# Patient Record
Sex: Male | Born: 1950 | Race: White | Hispanic: No | Marital: Married
Health system: Southern US, Community
[De-identification: ages and names within clinical notes are randomized; demographics above are authoritative.]

---

## 2000-04-06 ENCOUNTER — Encounter: Payer: Self-pay | Admitting: Family Medicine

## 2005-11-08 ENCOUNTER — Encounter: Payer: Self-pay | Admitting: Family Medicine

## 2005-11-08 LAB — CONVERTED CEMR LAB
Chloride: 103 meq/L
Glucose, Bld: 95 mg/dL
Hgb A1c MFr Bld: 5.7 %
Potassium: 4.9 meq/L
Triglycerides: 71 mg/dL

## 2006-03-21 ENCOUNTER — Encounter: Payer: Self-pay | Admitting: Family Medicine

## 2006-05-25 ENCOUNTER — Ambulatory Visit: Payer: Self-pay | Admitting: Family Medicine

## 2006-05-25 DIAGNOSIS — Z86718 Personal history of other venous thrombosis and embolism: Secondary | ICD-10-CM

## 2006-05-25 DIAGNOSIS — R7301 Impaired fasting glucose: Secondary | ICD-10-CM

## 2006-05-25 LAB — CONVERTED CEMR LAB
Creatinine, urine POC: 100 mg/dL
Microalbumin U total vol: 10 mg/L

## 2006-05-28 ENCOUNTER — Encounter: Payer: Self-pay | Admitting: Family Medicine

## 2006-06-15 ENCOUNTER — Ambulatory Visit: Payer: Self-pay | Admitting: Family Medicine

## 2006-06-15 DIAGNOSIS — K449 Diaphragmatic hernia without obstruction or gangrene: Secondary | ICD-10-CM

## 2006-09-12 ENCOUNTER — Ambulatory Visit: Payer: Self-pay | Admitting: Family Medicine

## 2006-09-12 LAB — CONVERTED CEMR LAB: Hgb A1c MFr Bld: 5.7 %

## 2006-10-16 ENCOUNTER — Encounter: Payer: Self-pay | Admitting: Family Medicine

## 2006-10-18 ENCOUNTER — Encounter: Payer: Self-pay | Admitting: Family Medicine

## 2006-10-19 ENCOUNTER — Encounter: Payer: Self-pay | Admitting: Family Medicine

## 2006-10-19 LAB — CONVERTED CEMR LAB
ALT: 11 units/L (ref 0–53)
AST: 13 units/L (ref 0–37)
Albumin: 4.2 g/dL (ref 3.5–5.2)
BUN: 14 mg/dL (ref 6–23)
CO2: 26 meq/L (ref 19–32)
Chloride: 103 meq/L (ref 96–112)
Creatinine, Ser: 0.84 mg/dL (ref 0.40–1.50)
Glucose, Bld: 86 mg/dL (ref 70–99)
HDL: 47 mg/dL (ref >39)
LDL Cholesterol: 85 mg/dL (ref 0–99)
VLDL: 12 mg/dL (ref 0–40)

## 2006-10-22 ENCOUNTER — Encounter: Payer: Self-pay | Admitting: Family Medicine

## 2006-10-25 ENCOUNTER — Encounter: Payer: Self-pay | Admitting: Family Medicine

## 2006-10-31 ENCOUNTER — Encounter: Payer: Self-pay | Admitting: Family Medicine

## 2006-11-06 ENCOUNTER — Encounter: Payer: Self-pay | Admitting: Family Medicine

## 2006-11-22 ENCOUNTER — Encounter: Payer: Self-pay | Admitting: Family Medicine

## 2006-12-12 ENCOUNTER — Ambulatory Visit: Payer: Self-pay | Admitting: Family Medicine

## 2006-12-12 LAB — CONVERTED CEMR LAB: Hgb A1c MFr Bld: 6 %

## 2006-12-26 ENCOUNTER — Encounter: Payer: Self-pay | Admitting: Family Medicine

## 2006-12-31 ENCOUNTER — Encounter: Payer: Self-pay | Admitting: Family Medicine

## 2007-01-22 ENCOUNTER — Encounter: Payer: Self-pay | Admitting: Family Medicine

## 2007-01-29 ENCOUNTER — Telehealth (INDEPENDENT_AMBULATORY_CARE_PROVIDER_SITE_OTHER): Payer: Self-pay | Admitting: *Deleted

## 2007-04-11 ENCOUNTER — Ambulatory Visit: Payer: Self-pay | Admitting: Family Medicine

## 2007-06-12 ENCOUNTER — Encounter: Admission: RE | Admit: 2007-06-12 | Discharge: 2007-06-12 | Payer: Self-pay | Admitting: Family Medicine

## 2007-06-12 ENCOUNTER — Ambulatory Visit: Payer: Self-pay | Admitting: Family Medicine

## 2007-06-12 DIAGNOSIS — R319 Hematuria, unspecified: Secondary | ICD-10-CM

## 2007-06-12 LAB — CONVERTED CEMR LAB
Bilirubin Urine: NEGATIVE
Glucose, Urine, Semiquant: NEGATIVE
Ketones, urine, test strip: NEGATIVE
Nitrite: NEGATIVE
Protein, U semiquant: NEGATIVE
WBC Urine, dipstick: NEGATIVE
pH: 7

## 2007-06-13 ENCOUNTER — Encounter: Admission: RE | Admit: 2007-06-13 | Discharge: 2007-06-13 | Payer: Self-pay | Admitting: Family Medicine

## 2007-06-26 ENCOUNTER — Ambulatory Visit: Payer: Self-pay | Admitting: Family Medicine

## 2007-06-26 DIAGNOSIS — N401 Enlarged prostate with lower urinary tract symptoms: Secondary | ICD-10-CM

## 2007-06-26 DIAGNOSIS — J984 Other disorders of lung: Secondary | ICD-10-CM | POA: Insufficient documentation

## 2007-06-26 LAB — CONVERTED CEMR LAB
Bilirubin Urine: NEGATIVE
Glucose, Urine, Semiquant: NEGATIVE
Specific Gravity, Urine: <1.005

## 2007-06-28 LAB — CONVERTED CEMR LAB
PSA, Free Pct: 26 (ref >25)
PSA, Free: 0.4 ng/mL
PSA: 1.53 ng/mL (ref 0.10–4.00)

## 2007-07-10 ENCOUNTER — Ambulatory Visit: Payer: Self-pay | Admitting: Family Medicine

## 2007-07-10 DIAGNOSIS — R3129 Other microscopic hematuria: Secondary | ICD-10-CM

## 2007-07-10 LAB — CONVERTED CEMR LAB
Glucose, Urine, Semiquant: NEGATIVE
Nitrite: NEGATIVE
Specific Gravity, Urine: 1.02
Urobilinogen, UA: 0.2
WBC Urine, dipstick: NEGATIVE
pH: 5.5

## 2007-07-11 LAB — CONVERTED CEMR LAB
ALT: 16 units/L (ref 0–53)
Albumin: 4.2 g/dL (ref 3.5–5.2)
BUN: 18 mg/dL (ref 6–23)
Basophils Absolute: 0 10*3/uL (ref 0.0–0.1)
Calcium: 9.2 mg/dL (ref 8.4–10.5)
Eosinophils Absolute: 0.1 10*3/uL (ref 0.0–0.7)
Folate: 18.6 ng/mL
Hemoglobin: 14.1 g/dL (ref 13.0–17.0)
Lymphs Abs: 1.8 10*3/uL (ref 0.7–4.0)
MCHC: 33.1 g/dL (ref 30.0–36.0)
MCV: 88.8 fL (ref 78.0–100.0)
Monocytes Absolute: 0.9 10*3/uL (ref 0.1–1.0)
Monocytes Relative: 12 % (ref 3–12)
Neutrophils Relative %: 63 % (ref 43–77)
Potassium: 4 meq/L (ref 3.5–5.3)
Sodium: 138 meq/L (ref 135–145)
Total Bilirubin: 0.9 mg/dL (ref 0.3–1.2)
Total Protein: 7.1 g/dL (ref 6.0–8.3)

## 2007-08-01 ENCOUNTER — Ambulatory Visit: Payer: Self-pay | Admitting: Family Medicine

## 2007-08-01 LAB — CONVERTED CEMR LAB: Hgb A1c MFr Bld: 6.1 %

## 2007-08-02 ENCOUNTER — Telehealth: Payer: Self-pay | Admitting: Family Medicine

## 2007-08-15 ENCOUNTER — Encounter: Payer: Self-pay | Admitting: Family Medicine

## 2007-10-22 ENCOUNTER — Encounter: Admission: RE | Admit: 2007-10-22 | Discharge: 2007-10-22 | Payer: Self-pay | Admitting: Family Medicine

## 2007-10-22 ENCOUNTER — Ambulatory Visit: Payer: Self-pay | Admitting: Family Medicine

## 2007-10-22 DIAGNOSIS — R0789 Other chest pain: Secondary | ICD-10-CM

## 2007-10-22 DIAGNOSIS — R071 Chest pain on breathing: Secondary | ICD-10-CM

## 2007-10-22 LAB — CONVERTED CEMR LAB
BUN: 11 mg/dL (ref 6–23)
Creatinine, Ser: 0.7 mg/dL (ref 0.40–1.50)

## 2007-10-23 ENCOUNTER — Ambulatory Visit (HOSPITAL_BASED_OUTPATIENT_CLINIC_OR_DEPARTMENT_OTHER): Admission: RE | Admit: 2007-10-23 | Discharge: 2007-10-23 | Payer: Self-pay | Admitting: Critical Care Medicine

## 2007-10-23 ENCOUNTER — Ambulatory Visit: Payer: Self-pay | Admitting: Critical Care Medicine

## 2007-10-25 ENCOUNTER — Encounter: Payer: Self-pay | Admitting: Critical Care Medicine

## 2007-10-25 LAB — CONVERTED CEMR LAB
Basophils Absolute: 0 10*3/uL (ref 0.0–0.1)
Basophils Relative: 0.1 % (ref 0.0–3.0)
Eosinophils Absolute: 0.1 10*3/uL (ref 0.0–0.7)
Hemoglobin: 13.5 g/dL (ref 13.0–17.0)
Lymphocytes Relative: 22.1 % (ref 12.0–46.0)
MCHC: 34.6 g/dL (ref 30.0–36.0)
MCV: 88 fL (ref 78.0–100.0)
Monocytes Absolute: 0.6 10*3/uL (ref 0.1–1.0)
Monocytes Relative: 10.4 % (ref 3.0–12.0)
Platelets: 212 10*3/uL (ref 150–400)
WBC: 5.3 10*3/uL (ref 4.5–10.5)

## 2007-11-07 ENCOUNTER — Ambulatory Visit: Payer: Self-pay | Admitting: Critical Care Medicine

## 2008-06-10 ENCOUNTER — Ambulatory Visit: Payer: Self-pay | Admitting: Family Medicine

## 2008-06-10 DIAGNOSIS — K089 Disorder of teeth and supporting structures, unspecified: Secondary | ICD-10-CM | POA: Insufficient documentation

## 2009-02-02 ENCOUNTER — Ambulatory Visit: Payer: Self-pay | Admitting: Family Medicine

## 2009-02-02 DIAGNOSIS — M1710 Unilateral primary osteoarthritis, unspecified knee: Secondary | ICD-10-CM

## 2009-03-04 ENCOUNTER — Telehealth (INDEPENDENT_AMBULATORY_CARE_PROVIDER_SITE_OTHER): Payer: Self-pay | Admitting: *Deleted

## 2009-03-05 ENCOUNTER — Encounter: Payer: Self-pay | Admitting: Family Medicine

## 2009-03-08 ENCOUNTER — Telehealth: Payer: Self-pay | Admitting: Family Medicine

## 2009-03-25 ENCOUNTER — Ambulatory Visit: Payer: Self-pay | Admitting: Family Medicine

## 2009-03-25 DIAGNOSIS — L03119 Cellulitis of unspecified part of limb: Secondary | ICD-10-CM

## 2009-03-25 DIAGNOSIS — J159 Unspecified bacterial pneumonia: Secondary | ICD-10-CM | POA: Insufficient documentation

## 2009-03-25 DIAGNOSIS — L02419 Cutaneous abscess of limb, unspecified: Secondary | ICD-10-CM | POA: Insufficient documentation

## 2009-03-26 LAB — CONVERTED CEMR LAB
Basophils Absolute: 0 10*3/uL (ref 0.0–0.1)
Eosinophils Relative: 2 % (ref 0–5)
MCV: 87.4 fL (ref 78.0–100.0)
Monocytes Relative: 8 % (ref 3–12)
Neutro Abs: 8.3 10*3/uL — ABNORMAL HIGH (ref 1.7–7.7)
Neutrophils Relative %: 77 % (ref 43–77)
Platelets: 449 10*3/uL — ABNORMAL HIGH (ref 150–400)
RBC: 4.06 M/uL — ABNORMAL LOW (ref 4.22–5.81)
RDW: 13.5 % (ref 11.5–15.5)

## 2009-08-26 ENCOUNTER — Ambulatory Visit: Payer: Self-pay | Admitting: Family Medicine

## 2009-08-26 DIAGNOSIS — J309 Allergic rhinitis, unspecified: Secondary | ICD-10-CM | POA: Insufficient documentation

## 2009-09-04 IMAGING — CR DG ABDOMEN 1V
2 series · 2 of 2 positions shown · non-contrast
Comparison: None

CLINICAL DATA: Abdominal pain evaluate for kidney stones

ABDOMEN - 1 VIEW

[view not recorded (1 of 2)]
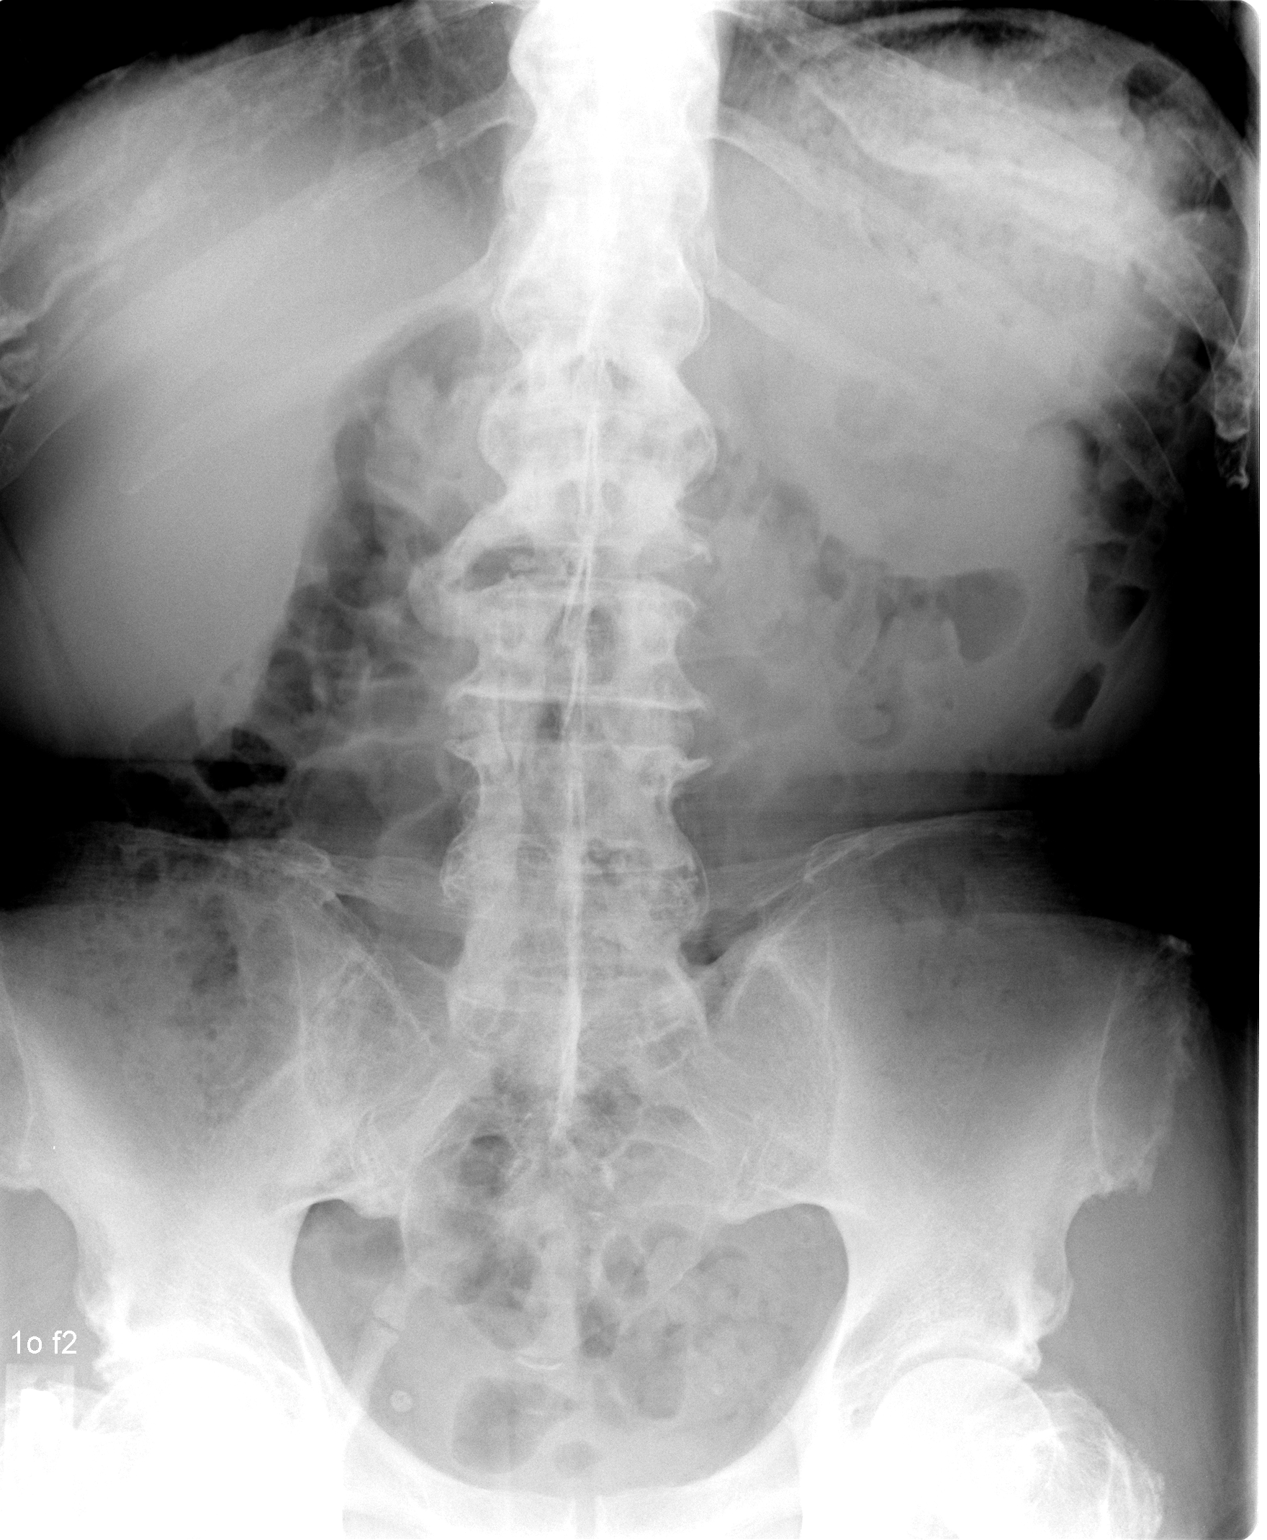

[view not recorded (2 of 2)]
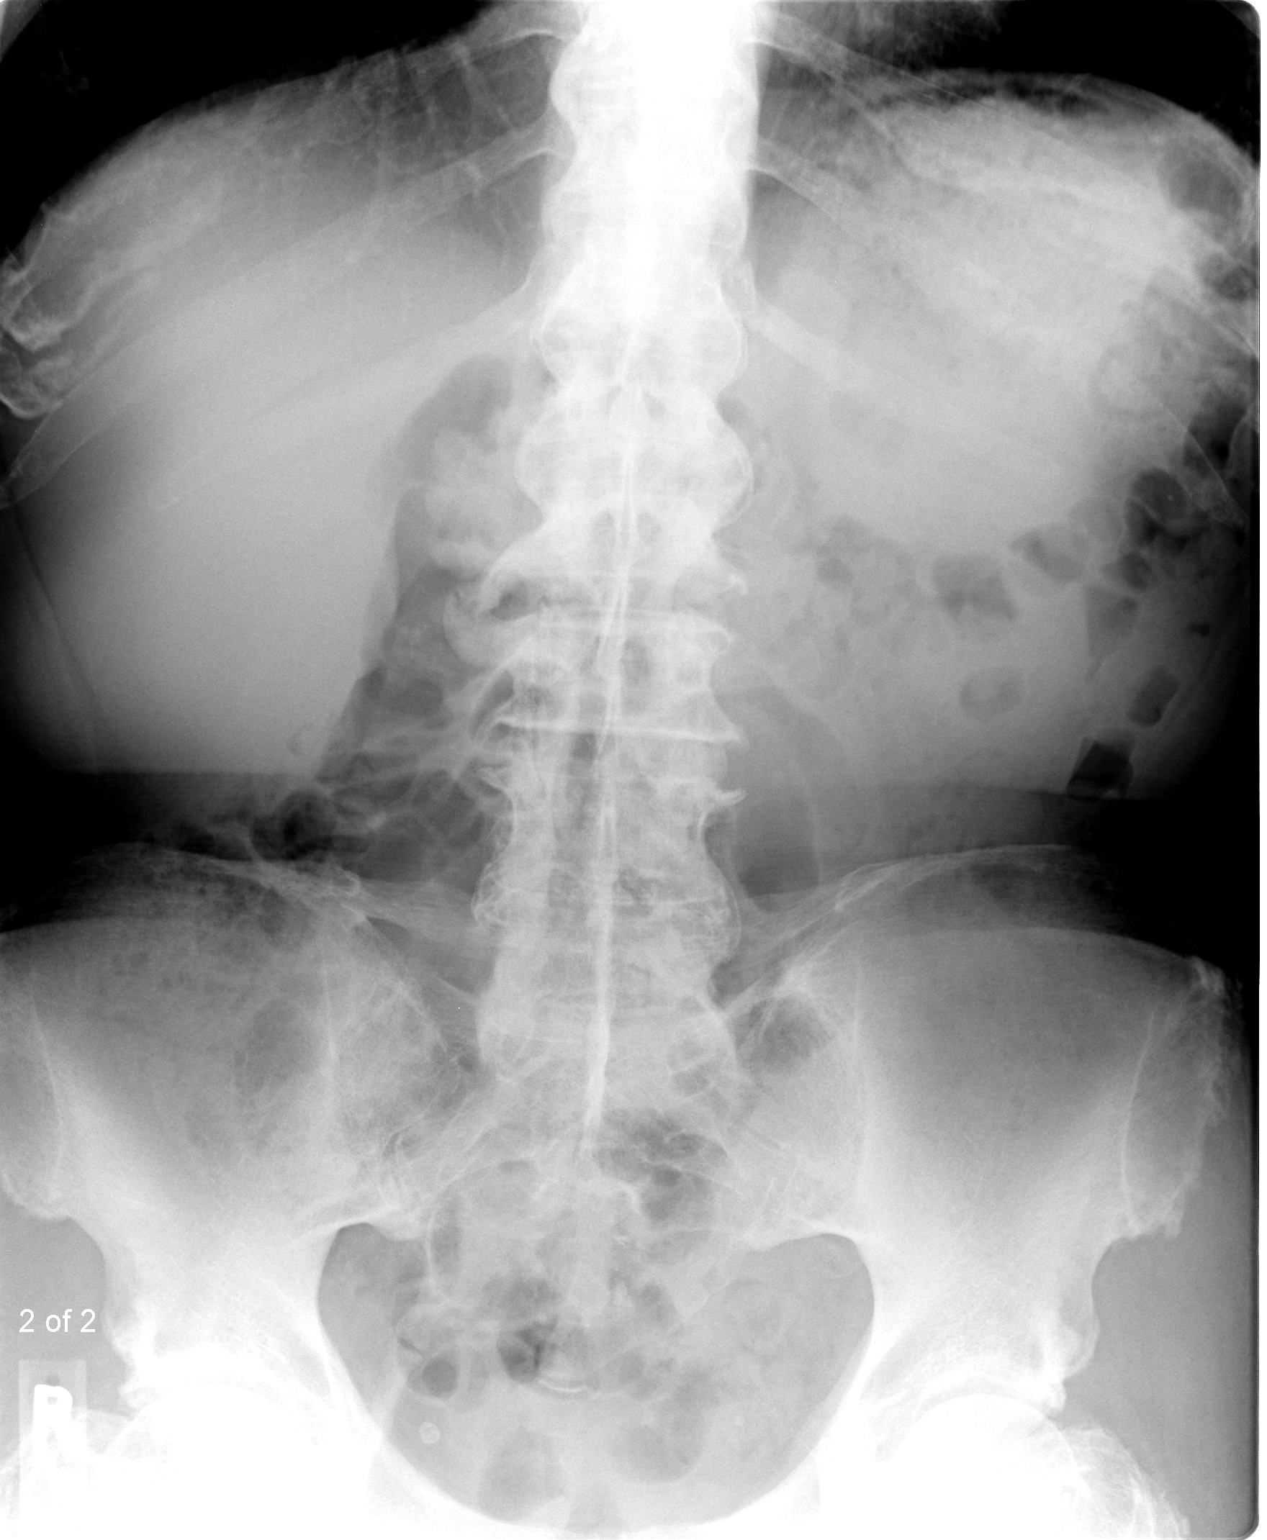

[2 of 2 positions shown; findings below may reference images not displayed]

FINDINGS: There are diffuse osteophytes noted throughout the lower
thoracic and lumbar spine.  This most likely represents diffuse
idiopathic skeletal hyperostosis.  However with calcification and
ossification noted of the ileolumbar ligament and sacrotuberous
ligaments, considerations are diffuse idiopathic skeletal
hyperostosis versus ankylosing spondylitis.  SI joints are not well
corticated No opaque renal calculi are seen.  The bowel gas pattern
is nonspecific.  .
IMPRESSION: 1.  No definite renal calculi.
2.  Diffuse osteophytes throughout the lumbar spine with
ossification of the pelvic ligaments.  These findings can be
indicative of D I [facility] or ankylosing spondylitis.

## 2009-11-15 ENCOUNTER — Ambulatory Visit: Payer: Self-pay | Admitting: Family Medicine

## 2009-11-15 DIAGNOSIS — D333 Benign neoplasm of cranial nerves: Secondary | ICD-10-CM | POA: Insufficient documentation

## 2009-11-17 ENCOUNTER — Telehealth (INDEPENDENT_AMBULATORY_CARE_PROVIDER_SITE_OTHER): Payer: Self-pay | Admitting: *Deleted

## 2009-12-07 ENCOUNTER — Telehealth: Payer: Self-pay | Admitting: Family Medicine

## 2009-12-14 DIAGNOSIS — R519 Headache, unspecified: Secondary | ICD-10-CM | POA: Insufficient documentation

## 2009-12-14 DIAGNOSIS — R51 Headache: Secondary | ICD-10-CM

## 2009-12-15 ENCOUNTER — Encounter: Payer: Self-pay | Admitting: Family Medicine

## 2009-12-19 ENCOUNTER — Telehealth: Payer: Self-pay | Admitting: Family Medicine

## 2009-12-20 ENCOUNTER — Telehealth (INDEPENDENT_AMBULATORY_CARE_PROVIDER_SITE_OTHER): Payer: Self-pay | Admitting: *Deleted

## 2010-02-03 ENCOUNTER — Ambulatory Visit
Admission: RE | Admit: 2010-02-03 | Discharge: 2010-02-03 | Payer: Self-pay | Source: Home / Self Care | Attending: Family Medicine | Admitting: Family Medicine

## 2010-02-03 DIAGNOSIS — L538 Other specified erythematous conditions: Secondary | ICD-10-CM | POA: Insufficient documentation

## 2010-02-03 LAB — CONVERTED CEMR LAB: Blood Glucose, Fingerstick: 96
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
## 2010-02-15 NOTE — Letter (Signed)
Summary: [facility]  [facility]   Imported By: Lanelle Bal 11/19/2009 10:29:48  _____________________________________________________________________  External Attachment:    Type:   Image     Comment:   External Document

## 2010-02-15 NOTE — Progress Notes (Signed)
Summary: Head pain  Phone Note Call from Patient   Summary of Call: Pt states he is still having pain on L side of head. Pt has stopped med bc it was not working. Please advise. Initial call taken by: Payton Spark CMA,  December 07, 2009 8:25 AM  Follow-up for Phone Call        I'd like him to see neurosurgery or ENT given his hx of acoustic neuroma.   Follow-up by: Seymour Bars DO,  December 07, 2009 10:27 AM  Additional Follow-up for Phone Call Additional follow up Details #1::        Pt states he prefers to see neurosurgery Additional Follow-up by: Payton Spark CMA,  December 07, 2009 11:39 AM    Additional Follow-up for Phone Call Additional follow up Details #2::    referral placed. Follow-up by: Seymour Bars DO,  December 07, 2009 12:32 PM   Appended Document: Head pain

## 2010-02-15 NOTE — Progress Notes (Signed)
Summary: Neurosurgeon referral   Phone Note From Other Clinic   Caller: Nurse Summary of Call: Pat from [facility] in [place] - Dr.Lyerly states that this patient was scheduled to see him for a referral from our office but after the neurosurgeon reviewed his MRI report he does not feel he needs to be seen since his tumor has not returned and suggest maybe he needs a pain clinic referral... Thay have called the patient and explained.Marland KitchenMarland KitchenMichaelle Copas  December 20, 2009 10:12 AM  Initial call taken by: Michaelle Copas,  December 20, 2009 10:14 AM  Follow-up for Phone Call        ok, so does he want to see Dr Jordan Likes? Follow-up by: Seymour Bars DO,  December 20, 2009 10:23 AM  Additional Follow-up for Phone Call Additional follow up Details #1::        Pt aware of the above

## 2010-02-15 NOTE — Letter (Signed)
Summary: [facility]  [facility]   Imported By: Lanelle Bal 03/12/2009 08:10:01  _____________________________________________________________________  External Attachment:    Type:   Image     Comment:   External Document

## 2010-02-15 NOTE — Progress Notes (Signed)
       New/Updated Medications: NEXIUM 40 MG CPDR (ESOMEPRAZOLE MAGNESIUM) Take 1 capsule by mouth once daily, take 20 minutes before breakfast Prescriptions: NEXIUM 40 MG CPDR (ESOMEPRAZOLE MAGNESIUM) Take 1 capsule by mouth once daily, take 20 minutes before breakfast  #30 x 1   Entered by:   Payton Spark CMA   Authorized by:   Seymour Bars DO   Signed by:   Payton Spark CMA on 11/17/2009   Method used:   Electronically to        [facility] [phone]* (retail)       [address]       [place], [place]  [address]       Ph: [phone] or [phone]       Fax: [phone]   RxID:   [id number]
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
## 2010-02-15 NOTE — Progress Notes (Signed)
Summary: Knee surgery  ---- Converted from flag ---- ---- 03/08/2009 9:17 AM, [name] wrote: Pt states doing pretty well- has home therapy coming out.  ---- 03/05/2009 5:17 PM, [name] MD wrote: Call pt: See how he is doing after his knee replacement. ------------------------------

## 2010-02-15 NOTE — Progress Notes (Signed)
Summary: post surgical Coumadin therapy  Phone Note From Other Clinic Call back at [phone]-pager   Caller: Suzanne-PA at Ortho office Call For: [place]en Summary of Call: PA called an asks if you would follow pt PT/INR because they are going to put him on Coumadin post surgical TKR. Please page PA at number above and speak with her Initial call taken by: [name],  March 04, 2009 11:02 AM  Follow-up for Phone Call        If they tell me proper length of coumadin treatment, I will be happy to monitor his coumadin post op. Follow-up by: Seymour Bars DO,  March 04, 2009 11:04 AM  Additional Follow-up for Phone Call Additional follow up Details #1::        Pt will have Genevieve Norlander coming out to his home. Genevieve Norlander will fax to you for management. Once Pt is cleared to drive he will start to come in our office. He needs atleast 6 weeks of therapy. Additional Follow-up by: Payton Spark CMA,  March 04, 2009 1:11 PM     Appended Document: post surgical Coumadin therapy In this case, we do not get paid to manage his coumadin.  Seymour Bars, D.O.  Appended Document: post surgical Coumadin therapy They will have Pt come into our office.Ortho doesn't feel comfortable managing  Appended Document: post surgical Coumadin therapy perfect.!  I'd be happy to do this.  Seymour Bars, D.O.

## 2010-02-15 NOTE — Progress Notes (Signed)
Summary: MRI brain   Phone Note Outgoing Call   Call placed by: Seymour Bars DO,  December 19, 2009 4:05 PM Summary of Call: Pls let pt know that MRI of the brain shows only postoperative changes in the L internal auditory canal.  He is welcome to consult with neurosurgeon, thought I do not see anything surgically to treat on here. Initial call taken by: Seymour Bars DO,  December 19, 2009 4:06 PM     Appended Document: MRI brain  Pt aware of the above
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
## 2011-05-15 ENCOUNTER — Encounter: Payer: Self-pay | Admitting: Physician Assistant

## 2011-05-15 ENCOUNTER — Ambulatory Visit (INDEPENDENT_AMBULATORY_CARE_PROVIDER_SITE_OTHER): Payer: BC Managed Care – PPO | Admitting: Physician Assistant

## 2011-05-15 VITALS — BP 151/77 | HR 62 | Ht 64.5 in | Wt 150.0 lb

## 2011-05-15 DIAGNOSIS — R030 Elevated blood-pressure reading, without diagnosis of hypertension: Secondary | ICD-10-CM

## 2011-05-15 MED ORDER — LISINOPRIL 10 MG PO TABS: 10.0000 mg | ORAL_TABLET | Freq: Every day | ORAL | Status: DC

## 2011-05-15 NOTE — Progress Notes (Signed)
  Subjective:    Patient ID: Omar Marsh, male    DOB: 1950/08/17, 61 y.o.   MRN: [id number]  HPI Patient presents to the clinic because he checked his blood pressure this morning and it was 167/90. He periodically checks his blood pressure on home. Normally he is in the 120s over 80 but for the last week or so he's been in the 140s or 50s over 90s. He did have knee replacement surgery 4 weeks ago. They stopped prescribing pain meds in about 10 days ago. He is only using Tylenol to control his pain. He reports that his pain on average is a 4/10 and then without Tylenol can increase to a 7/10. He does report that he is somewhat anxious but does not think he has a problem with anxiety. He denies any food changes with a lot of salt. He denies any headache, shortness of breath, chest pains, or palpitations. He has never had a blood pressure problem in the past.  Review of Systems     Objective:   Physical Exam  Constitutional: He is oriented to person, place, and time. He appears well-developed and well-nourished.  Cardiovascular: Normal rate, regular rhythm, normal heart sounds and intact distal pulses.   Pulmonary/Chest: Effort normal and breath sounds normal. He has no wheezes.  Neurological: He is alert and oriented to person, place, and time.  Skin: Skin is warm and dry.  Psychiatric: He has a normal mood and affect. His behavior is normal.          Assessment & Plan:  Elevated blood pressure-I suspect the elevated blood pressure today might be due to pain and anxiety. Patient was worried about this palpation therefore I did put him on lisinopril 10 mg once a day. I did instruct him that this could be a temporary medication. I suspect that when pain decreases his anxiety will decrease and then his blood pressure will decrease. Patient is used to exercising daily and once he gets back to walking and feeling better his blood pressure will back to normal. We will recheck in 1 month. I did  tell patient to continue monitoring his blood pressure on regular basis and it starts to notice a drop anything under 100 on the top that he should call office and we can start decreasing the lisinopril. I made patient aware of the side effect of cough if he started to have a cough he should call the office.  Patient is aware that he needs a colonoscopy and if he. He reports that he wants to wait until his: Better with his knee him to pursue does health maintenance items.

## 2011-05-15 NOTE — Patient Instructions (Addendum)
Make sure diet does not have a lot of salt. Continue to check blood pressure daily for next couple of weeks. Watch out as pain decreases for your blood pressure to decrease. Will start Lisinopril 10mg  once a day. Recheck 1 month.   Think about shingles vaccine.

## 2011-05-17 ENCOUNTER — Telehealth: Payer: Self-pay | Admitting: *Deleted

## 2011-05-17 NOTE — Telephone Encounter (Signed)
Called patient and let him know to increase his blood pressure medication to 2 tabs to equal 20mg  of lisinopril daily and to continue check bp regularly. He was reminded not to take bp after PT session or when in a lot of pain with knee replacement. He will call back in with blood pressure readings.

## 2011-05-17 NOTE — Telephone Encounter (Signed)
Pt states he has taken the Lisinopril for 2 days and states his bp is 187/90. States it is higher now than before. Please advise.

## 2011-05-18 ENCOUNTER — Telehealth: Payer: Self-pay | Admitting: *Deleted

## 2011-05-18 DIAGNOSIS — I10 Essential (primary) hypertension: Secondary | ICD-10-CM

## 2011-05-18 MED ORDER — LISINOPRIL-HYDROCHLOROTHIAZIDE 20-25 MG PO TABS: 1.0000 | ORAL_TABLET | Freq: Every day | ORAL | Status: DC

## 2011-05-18 NOTE — Telephone Encounter (Signed)
Pt notified of MD instructions. KJ LPN 

## 2011-05-18 NOTE — Telephone Encounter (Signed)
Will send over new BP med in its place.  Also needs labwork CMP, lipids, TSH. Will fax labslip.   If hasn't gone already.  THen keep f/u apptn.

## 2011-05-18 NOTE — Telephone Encounter (Signed)
Pt called yesterday and Lesly Rubenstein gave him instructions to increase his BP to 2 tabs. Pt has called stating that his BP now is 175/90. Please advise.

## 2011-05-19 LAB — COMPLETE METABOLIC PANEL WITH GFR
ALT: 10 U/L (ref 0–53)
Alkaline Phosphatase: 47 U/L (ref 39–117)
BUN: 13 mg/dL (ref 6–23)
CO2: 32 mEq/L (ref 19–32)
Calcium: 9.1 mg/dL (ref 8.4–10.5)
Creat: 0.71 mg/dL (ref 0.50–1.35)
GFR, Est African American: >89 mL/min
GFR, Est Non African American: >89 mL/min
Total Protein: 7.3 g/dL (ref 6.0–8.3)

## 2011-05-19 LAB — LIPID PANEL: Cholesterol: 176 mg/dL (ref 0–200)

## 2011-05-22 ENCOUNTER — Telehealth: Payer: Self-pay | Admitting: *Deleted

## 2011-05-26 ENCOUNTER — Encounter: Payer: Self-pay | Admitting: Physician Assistant

## 2011-05-26 ENCOUNTER — Ambulatory Visit (INDEPENDENT_AMBULATORY_CARE_PROVIDER_SITE_OTHER): Payer: BC Managed Care – PPO | Admitting: Physician Assistant

## 2011-05-26 VITALS — BP 116/74 | HR 70 | Ht 64.5 in | Wt 149.0 lb

## 2011-05-26 DIAGNOSIS — R031 Nonspecific low blood-pressure reading: Secondary | ICD-10-CM

## 2011-05-26 MED ORDER — LISINOPRIL-HYDROCHLOROTHIAZIDE 20-12.5 MG PO TABS: 1.0000 | ORAL_TABLET | Freq: Every day | ORAL | Status: DC

## 2011-05-26 NOTE — Patient Instructions (Addendum)
Recheck with nurse visit to check blood pressure in one week. Decreased Lisinopril 20/12.5mg  daily. Continue to check blood pressure. Call on Monday if still having lows.

## 2011-05-27 NOTE — Progress Notes (Signed)
  Subjective:    Patient ID: Omar Marsh, male    DOB: 1950/05/12, 61 y.o.   MRN: [id number]  HPI Patient comes in because his bp medication was changed b/c blood pressure still running high. He has been on Lisinopril/HCTZ 20/25 once a day. He is now having lows and feels like he is going to pass out every time he stands up. Checked bp and been running 90's over 60's. He is concerned b/c he lives alone.    Review of Systems     Objective:   Physical Exam  Constitutional: He is oriented to person, place, and time. He appears well-developed and well-nourished.  HENT:  Head: Normocephalic and atraumatic.  Cardiovascular: Normal rate, regular rhythm and normal heart sounds.   Pulmonary/Chest: Effort normal and breath sounds normal. He has no wheezes.  Neurological: He is alert and oriented to person, place, and time.  Skin: Skin is warm and dry.  Psychiatric: He has a normal mood and affect. His behavior is normal.          Assessment & Plan:  Low bp reading.-BP looks great today. Did discuss with patient that because bp has dropped significantly he might be feeling different because of the drop. Decrease Lisinopril/HCTZ 20/12.5 mg. I choose this because it did not get bp lowering benefit at lisinopril 20mg  therefore decrease to half what he was on. Will recheck with nurse visit in 1 week. If bp good will recheck in office in 1 month.

## 2011-06-01 ENCOUNTER — Telehealth: Payer: Self-pay | Admitting: *Deleted

## 2011-06-01 ENCOUNTER — Ambulatory Visit (INDEPENDENT_AMBULATORY_CARE_PROVIDER_SITE_OTHER): Payer: BC Managed Care – PPO | Admitting: Family Medicine

## 2011-06-01 VITALS — BP 117/57 | HR 67

## 2011-06-01 DIAGNOSIS — I10 Essential (primary) hypertension: Secondary | ICD-10-CM

## 2011-06-01 NOTE — Telephone Encounter (Signed)
Pt notfied of Dr. Instructions in re BP check and voiced understanding

## 2011-06-01 NOTE — Progress Notes (Addendum)
  Subjective:    Patient ID: Omar Marsh, male    DOB: 18-Apr-1950, 61 y.o.   MRN: [id number] BP check. Pt states did not take BP med this morning because his BP was 110/68. HPI    Review of Systems     Objective:   Physical Exam        Assessment & Plan:  HTN - BP is at goal. Hold BP med for one month and recheck in 1 months.  Cipriano Bunker, MD

## 2011-06-13 ENCOUNTER — Encounter: Payer: Self-pay | Admitting: Family Medicine

## 2011-06-13 ENCOUNTER — Ambulatory Visit (INDEPENDENT_AMBULATORY_CARE_PROVIDER_SITE_OTHER): Payer: BC Managed Care – PPO | Admitting: Family Medicine

## 2011-06-13 VITALS — BP 127/73 | HR 71 | Ht 64.5 in | Wt 152.0 lb

## 2011-06-13 DIAGNOSIS — R21 Rash and other nonspecific skin eruption: Secondary | ICD-10-CM

## 2011-06-13 DIAGNOSIS — B86 Scabies: Secondary | ICD-10-CM

## 2011-06-13 DIAGNOSIS — T887XXA Unspecified adverse effect of drug or medicament, initial encounter: Secondary | ICD-10-CM

## 2011-06-13 DIAGNOSIS — I959 Hypotension, unspecified: Secondary | ICD-10-CM

## 2011-06-13 DIAGNOSIS — I10 Essential (primary) hypertension: Secondary | ICD-10-CM

## 2011-06-13 DIAGNOSIS — L259 Unspecified contact dermatitis, unspecified cause: Secondary | ICD-10-CM

## 2011-06-13 MED ORDER — PERMETHRIN 5 % EX CREA: TOPICAL_CREAM | Freq: Once | CUTANEOUS | Status: AC

## 2011-06-13 MED ORDER — PREDNISONE (PAK) 10 MG PO TABS: ORAL_TABLET | ORAL | Status: DC

## 2011-06-13 NOTE — Patient Instructions (Signed)
Contact Dermatitis Contact dermatitis is a reaction to certain substances that touch the skin. Contact dermatitis can be either irritant contact dermatitis or allergic contact dermatitis. Irritant contact dermatitis does not require previous exposure to the substance for a reaction to occur.Allergic contact dermatitis only occurs if you have been exposed to the substance before. Upon a repeat exposure, your body reacts to the substance.  CAUSES  Many substances can cause contact dermatitis. Irritant dermatitis is most commonly caused by repeated exposure to mildly irritating substances, such as:  Makeup.   Soaps.   Detergents.   Bleaches.   Acids.   Metal salts, such as nickel.  Allergic contact dermatitis is most commonly caused by exposure to:  Poisonous plants.   Chemicals (deodorants, shampoos).   Jewelry.   Latex.   Neomycin in triple antibiotic cream.   Preservatives in products, including clothing.  SYMPTOMS  The area of skin that is exposed may develop:  Dryness or flaking.   Redness.   Cracks.   Itching.   Pain or a burning sensation.   Blisters.  With allergic contact dermatitis, there may also be swelling in areas such as the eyelids, mouth, or genitals.  DIAGNOSIS  Your caregiver can usually tell what the problem is by doing a physical exam. In cases where the cause is uncertain and an allergic contact dermatitis is suspected, a patch skin test may be performed to help determine the cause of your dermatitis. TREATMENT Treatment includes protecting the skin from further contact with the irritating substance by avoiding that substance if possible. Barrier creams, powders, and gloves may be helpful. Your caregiver may also recommend:  Steroid creams or ointments applied 2 times daily. For best results, soak the rash area in cool water for 20 minutes. Then apply the medicine. Cover the area with a plastic wrap. You can store the steroid cream in the  refrigerator for a "chilly" effect on your rash. That may decrease itching. Oral steroid medicines may be needed in more severe cases.   Antibiotics or antibacterial ointments if a skin infection is present.   Antihistamine lotion or an antihistamine taken by mouth to ease itching.   Lubricants to keep moisture in your skin.   Burow's solution to reduce redness and soreness or to dry a weeping rash. Mix one packet or tablet of solution in 2 cups cool water. Dip a clean washcloth in the mixture, wring it out a bit, and put it on the affected area. Leave the cloth in place for 30 minutes. Do this as often as possible throughout the day.   Taking several cornstarch or baking soda baths daily if the area is too large to cover with a washcloth.  Harsh chemicals, such as alkalis or acids, can cause skin damage that is like a burn. You should flush your skin for 15 to 20 minutes with cold water after such an exposure. You should also seek immediate medical care after exposure. Bandages (dressings), antibiotics, and pain medicine may be needed for severely irritated skin.  HOME CARE INSTRUCTIONS  Avoid the substance that caused your reaction.   Keep the area of skin that is affected away from hot water, soap, sunlight, chemicals, acidic substances, or anything else that would irritate your skin.   Do not scratch the rash. Scratching may cause the rash to become infected.   You may take cool baths to help stop the itching.   Only take over-the-counter or prescription medicines as directed by your caregiver.     See your caregiver for follow-up care as directed to make sure your skin is healing properly.  SEEK MEDICAL CARE IF:   Your condition is not better after 3 days of treatment.   You seem to be getting worse.   You see signs of infection such as swelling, tenderness, redness, soreness, or warmth in the affected area.   You have any problems related to your medicines.  Document Released:  12/31/1999 Document Revised: 12/22/2010 Document Reviewed: 06/07/2010 [facility] Patient Information 2012 [place], [place].Body Lice, Frequently Asked Questions Body lice are insects that live on the body and in the clothing or bedding of infested humans. Infestation is common, found worldwide, and affects people of all races. Body lice infestations spread rapidly under crowded conditions where hygiene is poor and where there is frequent contact among people.  WHERE ARE BODY LICE FOUND? Body lice are found on the body and on clothing or bedding used by infested people. Lice eggs are laid in the seams of clothing or on bedding. Occasionally eggs are attached to body hair. Lice found on the hair and head are not body lice; they are head lice. Lice are usually associated with poor personal hygiene, which may occur during war or natural disaster. Infestation is unlikely in anyone who bathes regularly. CAN BODY LICE TRANSMIT DISEASE? Yes. Epidemics of typhus and louse-borne relapsing fever have been caused by body lice. (Louse is the singular form of lice.) Though typhus is no longer widespread, epidemics still occur during times of war, civil unrest, natural disasters, in refugee camps, and in prisons where people live crowded together in unsanitary conditions. Typhus still exists in places where climate, chronic poverty, and social customs prevent regular changes and laundering of clothing. WHAT ARE THE SIGNS AND SYMPTOMS OF BODY LICE? Itching and rash are common. Both of these symptoms are your body's allergic reaction to the lice bite. Severe infestation can cause fever, body and headaches. Long-term body lice infestations may lead to thickening and discoloration of the skin, particularly around the waist, groin, and upper thighs. Sores on the body may be caused by scratching. These sores can sometimes become infected with bacteria or fungi. HOW ARE BODY LICE SPREAD? Body lice are spread directly through  contact with a person who has body lice, or indirectly through shared clothing, beds, bed linens, or towels. WHAT DO BODY LICE LOOK LIKE? There are three forms of body lice:   The egg (sometimes called a nit).   Nits are body lice eggs. They are generally easy to see in the seams of clothing, particularly around the waistline and under armpits. They are a bit smaller than the size of a pinhead. Nits may also be attached to body hair. They are oval and usually yellow to white. Nits may take 30 days to hatch.   The nymph.   The egg hatches into a baby louse called a nymph. It looks like an adult body louse but is smaller. Nymphs mature into adults about 7 days after hatching. To live, the nymph must feed on blood.   The adult.   The adult body louse is about the size of a sesame seed, has 6 legs and is tan to grayish-white. Females lay eggs. To live, adult lice need to feed on blood. If the louse falls off of a person, it dies within 10 days.  HOW IS A BODY LICE INFESTATION DIAGNOSED? Diagnosis is made by looking closely in the seams of clothing and on the body for eggs  and for crawling lice. Diagnosis should be made by a health care provider if you are unsure about infestation.  HOW ARE BODY LICE TREATED? Lice infestations are generally treated by giving the infested person a clean change of clothes, a shower, and by laundering all worn clothing, bed linens, and towels. When laundering items, use the hot cycle (130F / 55C) of the washing machine. Set the dryer to the hot cycle to dry items. Items that cannot be laundered may be stored in a sealed plastic bag for 2 weeks or thrown away. Additionally, a 1% permethrin or pyrethrin lice shampoo, (also called pediculicide), may be applied to the body. Medication should be applied exactly as directed on the bottle or by your caregiver. Medicine is generally not needed if good hygiene is maintained and if laundering can be done at least once a  week. Document Released: 09/14/2003 Document Revised: 12/22/2010 Document Reviewed: 07/22/2007 [facility] Patient Information 2012 [place], [place].

## 2011-06-13 NOTE — Progress Notes (Signed)
  Subjective:    Patient ID: Omar Marsh, male    DOB: May 20, 1950, 61 y.o.   MRN: [id number]  Rash This is a new problem. The current episode started 1 to 4 weeks ago. The problem has been gradually worsening since onset. The affected locations include the left elbow, right elbow, right shoulder, right arm, right lower leg and right upper leg. The rash is characterized by burning. He was exposed to nothing. Associated symptoms include fatigue and joint pain. Pertinent negatives include no congestion, cough, facial edema, fever, nail changes, rhinorrhea, shortness of breath, sore throat or vomiting. Past treatments include topical steroids. The treatment provided no relief. His past medical history is significant for eczema. There is no history of allergies, asthma or varicella.    Also with some dizzyness. Thinks his BP medication is too strong.  Review of Systems  Constitutional: Positive for fatigue. Negative for fever.  HENT: Negative for congestion, sore throat and rhinorrhea.   Respiratory: Negative for cough and shortness of breath.   Gastrointestinal: Negative for vomiting.  Musculoskeletal: Positive for joint pain.  Skin: Positive for rash. Negative for nail changes.      BP 127/73  Pulse 71  Ht 5' 4.5" (1.638 m)  Wt 152 lb (68.947 kg)  BMI 25.69 kg/m2  SpO2 98% Objective:   Physical Exam  Constitutional: He is oriented to person, place, and time. He appears well-developed and well-nourished.  Musculoskeletal:       Surgical scar present over both knees with the right knee swollen  and scar more prominent over the right knee.  Neurological: He is alert and oriented to person, place, and time.  Skin:       Rash is present over both sides of lower back mid back both arms and forearms as well. The rash is scattered as well and there is some small lesions consistent with burrowing that scabies would do There is also because of the marked excoriation present may be an element  of contact dermatitis as well.  Psychiatric: He has a normal mood and affect. His behavior is normal.      Assessment & Plan:  #1 rash. Possible pediculosis infection/contact dermatitis/history of eczema. We'll place patient on Elimite lotion with hygiene instructions on not to wear any clothes that he may have warn in the last 48 hours for at least another 48-72 hours. Changing his linen before he goes to bed tonight and again in the morning and leaving the lotion on for at least 8-12 hours and suggestion of treating his spouse as well.   For the contact dermatitis/history of eczema will place him on a tapered prednisone dosage over the next 12 days. He was warned of the possible rebound that can occur by not fishing his treatment. #2 hypertension /hypotension. He is concerned about his blood pressure medicine. He states that the current dosage of lisinopril 20/12.5 makes his blood pressure too low at times and makes him urinate and excessive amount that he is worried at times that he has prostate trouble. In reviewing the medication dosing that he has undergone I'm going to suggest to him that he takes a half a tablet making his dose of lisinopril HCTZ essentially 10/6.25. Return followup at his next regular scheduled appointment.

## 2011-07-06 ENCOUNTER — Ambulatory Visit (INDEPENDENT_AMBULATORY_CARE_PROVIDER_SITE_OTHER): Payer: BC Managed Care – PPO | Admitting: Family Medicine

## 2011-07-06 ENCOUNTER — Encounter: Payer: Self-pay | Admitting: Family Medicine

## 2011-07-06 VITALS — BP 105/50 | HR 66 | Wt 149.0 lb

## 2011-07-06 DIAGNOSIS — L259 Unspecified contact dermatitis, unspecified cause: Secondary | ICD-10-CM

## 2011-07-06 DIAGNOSIS — I10 Essential (primary) hypertension: Secondary | ICD-10-CM

## 2011-07-06 DIAGNOSIS — R21 Rash and other nonspecific skin eruption: Secondary | ICD-10-CM

## 2011-07-06 DIAGNOSIS — B86 Scabies: Secondary | ICD-10-CM

## 2011-07-06 MED ORDER — RANITIDINE HCL 150 MG PO TABS: 150.0000 mg | ORAL_TABLET | Freq: Two times a day (BID) | ORAL | Status: DC

## 2011-07-06 MED ORDER — PREDNISONE (PAK) 10 MG PO TABS: ORAL_TABLET | ORAL | Status: DC

## 2011-07-06 MED ORDER — FEXOFENADINE HCL 180 MG PO TABS: 180.0000 mg | ORAL_TABLET | Freq: Every day | ORAL | Status: DC

## 2011-07-06 MED ORDER — LISINOPRIL-HYDROCHLOROTHIAZIDE 20-12.5 MG PO TABS: 0.5000 | ORAL_TABLET | Freq: Every day | ORAL | Status: DC

## 2011-07-06 MED ORDER — DOXEPIN HCL 150 MG PO CAPS: 150.0000 mg | ORAL_CAPSULE | Freq: Every day | ORAL | Status: DC

## 2011-07-06 NOTE — Patient Instructions (Signed)
Hives Hives (urticaria) are itchy, red, swollen patches on the skin. They may change size, shape, and location quickly and repeatedly. Hives that occur deeper in the skin can cause swelling of the hands, feet, and face. Hives may be an allergic reaction to something you or your child ate, touched, or put on the skin. Hives can also be a reaction to cold, heat, viral infections, medication, insect bites, or emotional stress. Often the cause is hard to find. Hives can come and go for several days to several weeks. Hives are not contagious. HOME CARE INSTRUCTIONS   If the cause of the hives is known, avoid exposure to that source.   To relieve itching and rash:   Apply cold compresses to the skin or take cool water baths. Do not take or give your child hot baths or showers because the warmth will make the itching worse.   The best medicine for hives is an antihistamine. An antihistamine will not cure hives, but it will reduce their severity. You can use an antihistamine available over the counter. This medicine may make your child sleepy. Teenagers should not drive while using this medicine.   Take or give an antihistamine every 6 hours until the hives are completely gone for 24 hours or as directed.   Your child may have other medications prescribed for itching. Give these as directed by your child's caregiver.   You or your child should wear loose fitting clothing, including undergarments. Skin irritations may make hives worse.   Follow-up as directed by your caregiver.  SEEK MEDICAL CARE IF:   You or your child still have considerable itching after taking the medication (prescribed or purchased over the counter).   Joint swelling or pain occurs.  SEEK IMMEDIATE MEDICAL CARE IF:   You have a fever.   Swollen lips or tongue are noticed.   There is difficulty with breathing, swallowing, or tightness in the throat or chest.   Abdominal pain develops.   Your child starts acting very  sick.  These may be the first signs of a life-threatening allergic reaction. THIS IS AN EMERGENCY. Call 911 for medical help. MAKE SURE YOU:   Understand these instructions.   Will watch your condition.   Will get help right away if you are not doing well or get worse.  Document Released: 01/02/2005 Document Revised: 12/22/2010 Document Reviewed: 08/23/2007 [employer] Patient Information 2012 [employer]. 

## 2011-07-06 NOTE — Progress Notes (Signed)
  Subjective:    Patient ID: Omar Marsh, male    DOB: [birth year], 61 y.o.   MRN: [id number]  Urticaria This is a recurrent problem. The current episode started more than 1 month ago. The problem has been gradually worsening since onset. The affected locations include the torso, chest, left arm and right arm. The rash is characterized by itchiness. He was exposed to nothing. Pertinent negatives include no anorexia, congestion, cough, diarrhea, facial edema, fatigue, fever, joint pain, nail changes, rhinorrhea, shortness of breath, sore throat or vomiting. Past treatments include anti-itch cream (elmite  cream, prednisone). The treatment provided moderate relief. There is no history of allergies, asthma, eczema or varicella.   Rash his wife thinks he has a nervous condition. Should be noted that before I was concerned about pediculosis infection and he did follow instructions on the use of Elimite beyond my recommendation. He actually threw out pillowcases and new pillows as well. He did state that when he took the prednisone things did get a lot better quicker but once he finished the prednisone the rash came back.  #2 states that his blood pressure is doing much better with half a tablet of combination and in fact wants to know when he might be able to wean himself off of the blood pressure medicine. Review of Systems  Constitutional: Negative for fever and fatigue.  HENT: Negative for congestion, sore throat and rhinorrhea.   Respiratory: Negative for cough and shortness of breath.   Gastrointestinal: Negative for vomiting, diarrhea and anorexia.  Musculoskeletal: Negative for joint pain.  Skin: Negative for nail changes.  Psychiatric/Behavioral: The patient is nervous/anxious.         BP 105/50  Pulse 66  Wt 149 lb (67.586 kg) Objective:   Physical Exam  Constitutional: He is oriented to person, place, and time. He appears well-developed and well-nourished.  HENT:  Head:  Normocephalic.  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  Neurological: He is alert and oriented to person, place, and time.  Skin: Skin is warm and dry. Rash noted. There is erythema.       The rash does have some excoriation but it also blanches and excoriation is probably secondary to scratching but he has been doing.  Psychiatric: He has a normal mood and affect.      Assessment & Plan:  #I1rash/urticaria hives. I think instead of scabies is maybe some type of atypical urticaria. We'll place on a shorter course of prednisone only 6 days. Also place on H1 blocker Allegra 180, H2 blocker Zantac twice a day, and doxepin for the itching and his nerves. Return in 3 weeks followup. #2 hypertension under great control continue with half a tablet of lisinopril HCTZ.

## 2011-07-27 ENCOUNTER — Ambulatory Visit (INDEPENDENT_AMBULATORY_CARE_PROVIDER_SITE_OTHER): Payer: BC Managed Care – PPO | Admitting: Family Medicine

## 2011-07-27 ENCOUNTER — Encounter: Payer: Self-pay | Admitting: Family Medicine

## 2011-07-27 VITALS — BP 95/59 | HR 66 | Ht 64.5 in | Wt 151.0 lb

## 2011-07-27 DIAGNOSIS — B86 Scabies: Secondary | ICD-10-CM

## 2011-07-27 DIAGNOSIS — I10 Essential (primary) hypertension: Secondary | ICD-10-CM

## 2011-07-27 DIAGNOSIS — L259 Unspecified contact dermatitis, unspecified cause: Secondary | ICD-10-CM

## 2011-07-27 DIAGNOSIS — L509 Urticaria, unspecified: Secondary | ICD-10-CM

## 2011-07-27 DIAGNOSIS — R21 Rash and other nonspecific skin eruption: Secondary | ICD-10-CM

## 2011-07-27 MED ORDER — PREDNISONE (PAK) 10 MG PO TABS: ORAL_TABLET | ORAL | Status: DC

## 2011-07-27 MED ORDER — MONTELUKAST SODIUM 10 MG PO TABS: 10.0000 mg | ORAL_TABLET | Freq: Every day | ORAL | Status: DC

## 2011-07-27 NOTE — Progress Notes (Signed)
  Subjective:    Patient ID: Omar Marsh, male    DOB: January 23, 1950, 61 y.o.   MRN: [id number]  Rash   The patient and his wife reports that 3 or 4 days after using the prednisone the rash recur. He states that as long as he is on the prednisone everything is fine but several days later the rash will recur. He also reports that the doxepin caused CNS side effects the following day so he stopped that. He is still taking the Zantac and the antihistamine. He reports significant frustration this condition and that of asked if there is anything else they can use other than the prednisone. He has seen a local dermatologist before but his wife wasn't too thrilled about returning to see him.   Review of Systems  Skin: Positive for rash.      BP 95/59  Pulse 66  Ht 5' 4.5" (1.638 m)  Wt 151 lb (68.493 kg)  BMI 25.52 kg/m2  SpO2 100% Objective:   Physical Exam  Constitutional: He is oriented to person, place, and time. He appears well-nourished.  Neurological: He is alert and oriented to person, place, and time.  Skin:       Fine raised rash present on knees and elbows consistent with previous rash.      Assessment & Plan:  Rash. Will get a dermatology referral to obtain a second opinion at the [facility] per their request. Will try him on Singulair to see if that will help if this is allergic based. Because there may be a delay of 3-6 weeks before he can see a dermatologist we'll put him on another round of prednisone if needed. Did explain to them that I want him off the prednisone for least 2 weeks before they see a dermatologist.

## 2011-07-27 NOTE — Patient Instructions (Signed)
Urticaria  Hives Hives (urticaria) are itchy, red, swollen patches on the skin. They may change size, shape, and location quickly and repeatedly. Hives that occur deeper in the skin can cause swelling of the hands, feet, and face. Hives may be an allergic reaction to something you or your child ate, touched, or put on the skin. Hives can also be a reaction to cold, heat, viral infections, medication, insect bites, or emotional stress. Often the cause is hard to find. Hives can come and go for several days to several weeks. Hives are not contagious. HOME CARE INSTRUCTIONS   If the cause of the hives is known, avoid exposure to that source.   To relieve itching and rash:   Apply cold compresses to the skin or take cool water baths. Do not take or give your child hot baths or showers because the warmth will make the itching worse.   The best medicine for hives is an antihistamine. An antihistamine will not cure hives, but it will reduce their severity. You can use an antihistamine available over the counter. This medicine may make your child sleepy. Teenagers should not drive while using this medicine.   Take or give an antihistamine every 6 hours until the hives are completely gone for 24 hours or as directed.   Your child may have other medications prescribed for itching. Give these as directed by your child's caregiver.   You or your child should wear loose fitting clothing, including undergarments. Skin irritations may make hives worse.   Follow-up as directed by your caregiver.  SEEK MEDICAL CARE IF:   You or your child still have considerable itching after taking the medication (prescribed or purchased over the counter).   Joint swelling or pain occurs.  SEEK IMMEDIATE MEDICAL CARE IF:   You have a fever.   Swollen lips or tongue are noticed.   There is difficulty with breathing, swallowing, or tightness in the throat or chest.   Abdominal pain develops.   Your child starts  acting very sick.  These may be the first signs of a life-threatening allergic reaction. THIS IS AN EMERGENCY. Call 911 for medical help. MAKE SURE YOU:   Understand these instructions.   Will watch your condition.   Will get help right away if you are not doing well or get worse.  Document Released: 01/02/2005 Document Revised: 12/22/2010 Document Reviewed: 08/23/2007 [facility] Patient Information 2012 [place], [place].

## 2011-08-29 ENCOUNTER — Ambulatory Visit: Payer: BC Managed Care – PPO | Admitting: Family Medicine

## 2011-11-30 ENCOUNTER — Ambulatory Visit: Payer: BC Managed Care – PPO | Admitting: Family Medicine

## 2011-12-07 ENCOUNTER — Ambulatory Visit (INDEPENDENT_AMBULATORY_CARE_PROVIDER_SITE_OTHER): Payer: BC Managed Care – PPO | Admitting: Family Medicine

## 2011-12-07 ENCOUNTER — Encounter: Payer: Self-pay | Admitting: Family Medicine

## 2011-12-07 ENCOUNTER — Ambulatory Visit: Payer: BC Managed Care – PPO | Admitting: Family Medicine

## 2011-12-07 ENCOUNTER — Ambulatory Visit (INDEPENDENT_AMBULATORY_CARE_PROVIDER_SITE_OTHER): Payer: BC Managed Care – PPO

## 2011-12-07 VITALS — BP 122/71 | HR 62 | Ht 64.5 in | Wt 153.0 lb

## 2011-12-07 DIAGNOSIS — Z23 Encounter for immunization: Secondary | ICD-10-CM

## 2011-12-07 DIAGNOSIS — R7301 Impaired fasting glucose: Secondary | ICD-10-CM

## 2011-12-07 DIAGNOSIS — Z0000 Encounter for general adult medical examination without abnormal findings: Secondary | ICD-10-CM

## 2011-12-07 DIAGNOSIS — I10 Essential (primary) hypertension: Secondary | ICD-10-CM

## 2011-12-07 DIAGNOSIS — M542 Cervicalgia: Secondary | ICD-10-CM

## 2011-12-07 DIAGNOSIS — R21 Rash and other nonspecific skin eruption: Secondary | ICD-10-CM

## 2011-12-07 DIAGNOSIS — Z131 Encounter for screening for diabetes mellitus: Secondary | ICD-10-CM

## 2011-12-07 LAB — POCT GLYCOSYLATED HEMOGLOBIN (HGB A1C): Hemoglobin A1C: 6.1

## 2011-12-07 MED ORDER — MELOXICAM 15 MG PO TABS: 15.0000 mg | ORAL_TABLET | Freq: Every day | ORAL | Status: DC

## 2011-12-07 MED ORDER — ORPHENADRINE CITRATE ER 100 MG PO TB12: 100.0000 mg | ORAL_TABLET | Freq: Two times a day (BID) | ORAL | Status: DC

## 2011-12-07 MED ORDER — PREDNISONE (PAK) 10 MG PO TABS: 10.0000 mg | ORAL_TABLET | Freq: Every day | ORAL | Status: DC

## 2011-12-07 NOTE — Patient Instructions (Addendum)
Pneumococcal Vaccine, Polyvalent solution for injection What is this medicine? PNEUMOCOCCAL VACCINE, POLYVALENT (NEU mo KOK al vak SEEN, pol ee VEY luhnt) is a vaccine to prevent pneumococcus bacteria infection. These bacteria are a major cause of ear infections, Strep throat infections, and serious pneumonia, meningitis, or blood infections worldwide. These vaccines help the body to produce antibodies (protective substances) that help your body defend against these bacteria. This vaccine is recommended for people 2 years of age and older with health problems. It is also recommended for all adults over 50 years old. This vaccine will not treat an infection. This medicine may be used for other purposes; ask your health care provider or pharmacist if you have questions. What should I tell my health care provider before I take this medicine? They need to know if you have any of these conditions: -bleeding problems -bone marrow or organ transplant -cancer, Hodgkin's disease -fever -infection -immune system problems -low platelet count in the blood -seizures -an unusual or allergic reaction to pneumococcal vaccine, diphtheria toxoid, other vaccines, latex, other medicines, foods, dyes, or preservatives -pregnant or trying to get pregnant -breast-feeding How should I use this medicine? This vaccine is for injection into a muscle or under the skin. It is given by a health care professional. A copy of Vaccine Information Statements will be given before each vaccination. Read this sheet carefully each time. The sheet may change frequently. Talk to your pediatrician regarding the use of this medicine in children. While this drug may be prescribed for children as young as 2 years of age for selected conditions, precautions do apply. Overdosage: If you think you have taken too much of this medicine contact a poison control center or emergency room at once. NOTE: This medicine is only for you. Do not share  this medicine with others. What if I miss a dose? It is important not to miss your dose. Call your doctor or health care professional if you are unable to keep an appointment. What may interact with this medicine? -medicines for cancer chemotherapy -medicines that suppress your immune function -medicines that treat or prevent blood clots like warfarin, enoxaparin, and dalteparin -steroid medicines like prednisone or cortisone This list may not describe all possible interactions. Give your health care provider a list of all the medicines, herbs, non-prescription drugs, or dietary supplements you use. Also tell them if you smoke, drink alcohol, or use illegal drugs. Some items may interact with your medicine. What should I watch for while using this medicine? Mild fever and pain should go away in 3 days or less. Report any unusual symptoms to your doctor or health care professional. What side effects may I notice from receiving this medicine? Side effects that you should report to your doctor or health care professional as soon as possible: -allergic reactions like skin rash, itching or hives, swelling of the face, lips, or tongue -breathing problems -confused -fever over 102 degrees F -pain, tingling, numbness in the hands or feet -seizures -unusual bleeding or bruising -unusual muscle weakness Side effects that usually do not require medical attention (report to your doctor or health care professional if they continue or are bothersome): -aches and pains -diarrhea -fever of 102 degrees F or less -headache -irritable -loss of appetite -pain, tender at site where injected -trouble sleeping This list may not describe all possible side effects. Call your doctor for medical advice about side effects. You may report side effects to FDA at 1-800-FDA-1088. Where should I keep my medicine? This   does not apply. This vaccine is given in a clinic, pharmacy, doctor's office, or other health care  setting and will not be stored at home. NOTE: This sheet is a summary. It may not cover all possible information. If you have questions about this medicine, talk to your doctor, pharmacist, or health care provider.  2012, [employer]. (08/09/2007 2:32:37 PM)Pneumococcal Pneumonia Pneumonia is an infection of the lungs, and is typically caused by tiny (microscopic) living organisms such as bacteria and viruses. Streptococcus pneumoniae (also referred to as the pneumococcus, Streptococcal pneumonia, and Pneumococcal pneumonia) is the most common bacteria that causes pneumonia in adults and children. This infection is not contagious (it is not spread from person to person). The organism is typically found in the nose and throat of healthy adults and children, but does not cause infection at these sites. Pneumonia occurs when the pneumococcus is breathed into the deeper part of the lungs (respiratory tract).  In some people, particularly the very young or elderly and those who are debilitated, pneumococcal pneumonia may follow influenza or even a common cold. Some people may also get pneumonia while staying in a hospital for other conditions. SYMPTOMS  The most common symptoms are cough, fever, chest pain, an increased rate of breathing, wheezing, and sputum production.  DIAGNOSIS   Pneumonia is diagnosed by your caregiver using a combination of:   Analyzing your symptoms.   Listening to your lungs with a stethoscope.   Chest x-ray.   Diagnosing pneumococcal pneumonia requires growing the bacteria from a culture of your sputum or blood.  TREATMENT  Pneumococcal pneumonia generally responds well to antibiotic medications. Patients can be treated in hospital or at home, depending on their overall condition.  Vaccination A pneumococcal shot (vaccine) is available to prevent this common cause of bacterial pneumonia and its potential complications. Among adults, it is suggested or recommended  for people 82 years old and older and for groups of individuals at high risk of acquiring this infection. These include individuals:  With lung or breathing problems (such as smokers, or people with asthma, emphysema and chronic bronchitis).   On chemotherapy.   With problems of their immune system.  You may be offered or have been given a pneumococcal vaccination as part of your treatment or hospitalization. If you are being treated in a hospital:  Your health care provider may prescribe medicine you can take by mouth or by intravenous (IV) means. This means your medicine is given into a vein.   You may be given oxygen and IV fluids.   Your heart rate and breathing may be monitored.   X-rays are frequently taken to keep an eye on your pneumonia.   You will have pneumococcal screening or vaccination if you are over 74 years old and are not immunized.   Your caregiver may do tests (such as blood gasses or pulse oximetry) to see how well your lungs are working.   If you are a smoker, it is time to quit. You will receive instructions on how to best stop smoking. Your caregivers can provide medicine and counseling to help you quit.   Usually you will begin to feel better after 2 to 3 days of antibiotics. If you are an otherwise healthy person, you should feel close to normal after a week or so. If you are over 56 years old or have other medical problems, it may take longer to feel normal.  HOME CARE INSTRUCTIONS   Get plenty of rest but also  get up as much as possible. Follow your caregivers' instructions for returning to activities.   Drink more liquids (water, tea, or fruit juice) every day to help you cough up mucus more easily.   Cough suppressants may be used if you are losing too much rest. However, a cough protects [place] by clearing our lungs. This is one reason for not overusing cough suppressants.   Your cough may be worse at night. Sleeping in a semi-upright position in a  recliner, or using a couple pillows under your head will help with this.   Take all your prescribed medication until finished.   Your caregiver may also prescribe an expectorant to loosen the mucus. Cough up secretions as much as possible.   Only take over-the-counter or prescription medicines for pain, discomfort, or fever as directed by your caregiver.   Smoking is a common cause of bronchitis and can contribute to pneumonia. Stopping this habit is an important self help step.   A cold steam vaporizer or humidifier in your room or home may help loosen mucus.   Rest as you need it. Your body will usually let you know when to rest.   If you are a smoker and continue to smoke, your cough may last several weeks after your pneumonia has cleared.   Use a heating pad on a low setting to reduce chest discomfort. Do not sleep with a heating pad.   Call your caregiver if you feel you are getting worse or if you are not getting better in 2 to 3 days.  PREVENTION   Stop smoking. Smoking decreases your ability to get well.   Wear a mask when cleaning dusty or moldy areas and around people who may be infectious. Avoid visiting people when you know they are infectious even though this pneumonia is usually not catchy.   Ask your caregiver what vaccines you should have. This is especially important if you:   Are in a care setting such as a nursing home.   Work around a lot of other people.   Have breathing problems.   Are immunocompromised with cancer chemotherapy or have HIV.   Pneumococcal vaccine (Pneumovax, Prevnar) prevents pneumococcal infections and complications of this infection.   Flu vaccine prevents pneumonia and other infections caused by influenza viruses. It must be taken yearly to protect against new viral strains. Take it every fall when it is available at your caregivers practice site.   Hib vaccine prevents pneumonia in children from Haemophilus influenzae type b. Having  children vaccinated helps keep them from bringing home infections to their older loved ones.  SEEK IMMEDIATE MEDICAL CARE IF:   You develop more pus like (purulent) sputum, have an uncontrolled temperature, or your illness becomes worse. This is especially important if you are elderly or weakened from any other disease.   You can not control your cough with suppressants and are losing sleep.   You begin coughing up blood.   You develop pain that is getting worse or is uncontrolled with medications, or you have chest pain that worsens with coughing or breathing.   You develop a new oral temperature above 102 F (38.9 C), after not having a temperature for one or more days.   Any of the symptoms which initially brought you in for treatment are getting worse rather than better, or you develop shortness of breath or chest pain.  Document Released: 04/14/2005 Document Revised: 09/14/2010 Document Reviewed: 07/15/2008 [facility] Patient Information 2012 [place], [place].Exercise to Lose  Weight Exercise and a healthy diet may help you lose weight. Your doctor may suggest specific exercises. EXERCISE IDEAS AND TIPS  Choose low-cost things you enjoy doing, such as walking, bicycling, or exercising to workout videos.  Take stairs instead of the elevator.  Walk during your lunch break.  Park your car further away from work or school.  Go to a gym or an exercise class.  Start with 5 to 10 minutes of exercise each day. Build up to 30 minutes of exercise 4 to 6 days a week.  Wear shoes with good support and comfortable clothes.  Stretch before and after working out.  Work out until you breathe harder and your heart beats faster.  Drink extra water when you exercise.  Do not do so much that you hurt yourself, feel dizzy, or get very short of breath. Exercises that burn about 150 calories:  Running 1  miles in 15 minutes.  Playing volleyball for 45 to 60 minutes.  Washing and waxing a  car for 45 to 60 minutes.  Playing touch football for 45 minutes.  Walking 1  miles in 35 minutes.  Pushing a stroller 1  miles in 30 minutes.  Playing basketball for 30 minutes.  Raking leaves for 30 minutes.  Bicycling 5 miles in 30 minutes.  Walking 2 miles in 30 minutes.  Dancing for 30 minutes.  Shoveling snow for 15 minutes.  Swimming laps for 20 minutes.  Walking up stairs for 15 minutes.  Bicycling 4 miles in 15 minutes.  Gardening for 30 to 45 minutes.  Jumping rope for 15 minutes.  Washing windows or floors for 45 to 60 minutes. Document Released: 02/04/2010 Document Revised: 03/27/2011 Document Reviewed: 02/04/2010 [facility] Patient Information 2013 [place], [place].

## 2011-12-07 NOTE — Progress Notes (Signed)
Subjective:    Patient ID: Omar Marsh, male    DOB: July 24, 1950, 61 y.o.   MRN: [id number]  HPI #1 immunization update.Marland Kitchen He needs pneumonia vaccination, flu vaccination and shingles vaccination.   #2 history of hypertension. He states his blood pressures has really dropped sometimes to a systolic in the low 100s. He denies any dizziness or lightheadedness that significant and uses the days goes on his blood pressure goes back up.  #3 nonspecific rash. He states by the time he got to see the dermatologist the rash is clear he canceled appointment and of course it did come back. It was not as bad. He tried to get back in to see the dermatologist but that took several weeks and so in the interim he took the last prescription of prednisone and things got better. He states the dermatologist was unable to give him any clear diagnosis since the rash is cleared when he got there. He would like to have a prescription for prednisone to use when the rash recurs.   #4 impaired glucose tolerance/borderline diabetes. We'll need to check A1c.   #5 neck pain. He reports having pain in his neck that has gotten worse over the last 6 weeks. 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  Review of Systems  Musculoskeletal: Positive for myalgias and arthralgias.  All other systems reviewed and are negative.        BP 122/71  Pulse 62  Ht 5' 4.5" (1.638 m)  Wt 153 lb (69.4 kg)  BMI 25.86 kg/m2 Objective:   Physical Exam  Vitals reviewed. Constitutional: He appears well-developed and well-nourished.  HENT:  Head: Normocephalic.  Musculoskeletal: Normal range of motion. He exhibits tenderness. He exhibits no edema.       Tenderness along the lower C-spine C6 through C8.  Neurological: He is alert.  Skin: Skin is warm and dry. No rash noted. No erythema.  Psychiatric: He has a  normal mood and affect.      Lab Results  Component Value Date   HGBA1C 6.1 12/07/2011   Assessment & Plan:  #1 immunizations up-to-date. Patient given pneumococcus vaccination today. His return in about a one to two-month period to get his shingles vaccination. Discussed with him about flu vaccination even though he had the flu last year for the first time he states that many years he states that his always had a reaction very shortly after getting the flu shots his mother when he was about 13 to told him not to get the flu vaccination anymore.  #2 hypertension. At this time since he is only taking a half to a fourth tablet of his lisinopril and he reports when he is off of it for more than 4 days his blood pressure goes up high I would continue with his current treatment regimen.  #3 rash. We'll renew prednisone for his request with one refill for him to take only when the rash recurs.  #4 will x-ray his C-spine. We'll place on Mobic and Norflex for cervical pain.  #5 borderline diabetes/abnormal glucose tolerance. Once again despite both knees be replaced he needs to start exercising again and I recommended going to the gym.  Return to the office in 2-4 months. Patient informed of his provider plans on leaving this practice in the very near future.

## 2011-12-25 ENCOUNTER — Telehealth: Payer: Self-pay | Admitting: *Deleted

## 2011-12-25 NOTE — Telephone Encounter (Signed)
Pt got letter for jury duty and he says he can not go because he can not hear. 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 Needs letter stating this to get out of it.

## 2012-01-31 ENCOUNTER — Other Ambulatory Visit: Payer: Self-pay | Admitting: *Deleted

## 2012-01-31 DIAGNOSIS — M542 Cervicalgia: Secondary | ICD-10-CM

## 2012-01-31 MED ORDER — ORPHENADRINE CITRATE ER 100 MG PO TB12: 100.0000 mg | ORAL_TABLET | Freq: Two times a day (BID) | ORAL | Status: DC

## 2012-07-17 ENCOUNTER — Ambulatory Visit (INDEPENDENT_AMBULATORY_CARE_PROVIDER_SITE_OTHER): Payer: BC Managed Care – PPO | Admitting: Sports Medicine

## 2012-07-17 ENCOUNTER — Encounter: Payer: Self-pay | Admitting: Sports Medicine

## 2012-07-17 VITALS — BP 97/60 | HR 55 | Wt 151.0 lb

## 2012-07-17 DIAGNOSIS — E119 Type 2 diabetes mellitus without complications: Secondary | ICD-10-CM

## 2012-07-17 DIAGNOSIS — N401 Enlarged prostate with lower urinary tract symptoms: Secondary | ICD-10-CM

## 2012-07-17 DIAGNOSIS — N200 Calculus of kidney: Secondary | ICD-10-CM

## 2012-07-17 DIAGNOSIS — Z23 Encounter for immunization: Secondary | ICD-10-CM

## 2012-07-17 DIAGNOSIS — Z86718 Personal history of other venous thrombosis and embolism: Secondary | ICD-10-CM

## 2012-07-17 DIAGNOSIS — N138 Other obstructive and reflux uropathy: Secondary | ICD-10-CM

## 2012-07-17 DIAGNOSIS — D333 Benign neoplasm of cranial nerves: Secondary | ICD-10-CM

## 2012-07-17 DIAGNOSIS — J984 Other disorders of lung: Secondary | ICD-10-CM

## 2012-07-17 DIAGNOSIS — IMO0002 Reserved for concepts with insufficient information to code with codable children: Secondary | ICD-10-CM

## 2012-07-17 DIAGNOSIS — I10 Essential (primary) hypertension: Secondary | ICD-10-CM

## 2012-07-17 DIAGNOSIS — R7301 Impaired fasting glucose: Secondary | ICD-10-CM

## 2012-07-17 DIAGNOSIS — Z299 Encounter for prophylactic measures, unspecified: Secondary | ICD-10-CM | POA: Insufficient documentation

## 2012-07-17 DIAGNOSIS — L259 Unspecified contact dermatitis, unspecified cause: Secondary | ICD-10-CM

## 2012-07-17 DIAGNOSIS — L309 Dermatitis, unspecified: Secondary | ICD-10-CM | POA: Insufficient documentation

## 2012-07-17 DIAGNOSIS — M1710 Unilateral primary osteoarthritis, unspecified knee: Secondary | ICD-10-CM

## 2012-07-17 LAB — COMPREHENSIVE METABOLIC PANEL WITH GFR
Albumin: 4.2 g/dL (ref 3.5–5.2)
Alkaline Phosphatase: 44 U/L (ref 39–117)
BUN: 17 mg/dL (ref 6–23)
CO2: 27 meq/L (ref 19–32)
Glucose, Bld: 101 mg/dL — ABNORMAL HIGH (ref 70–99)
Total Bilirubin: 0.9 mg/dL (ref 0.3–1.2)

## 2012-07-17 LAB — LIPID PANEL
Cholesterol: 174 mg/dL (ref 0–200)
HDL: 44 mg/dL (ref >39)
LDL Cholesterol: 119 mg/dL — ABNORMAL HIGH (ref 0–99)
Total CHOL/HDL Ratio: 4 Ratio
Triglycerides: 53 mg/dL (ref <150)
VLDL: 11 mg/dL (ref 0–40)

## 2012-07-17 LAB — COMPREHENSIVE METABOLIC PANEL
ALT: 10 U/L (ref 0–53)
AST: 16 U/L (ref 0–37)
Calcium: 9.4 mg/dL (ref 8.4–10.5)
Chloride: 100 mEq/L (ref 96–112)
Creat: 0.81 mg/dL (ref 0.50–1.35)
Potassium: 4.6 mEq/L (ref 3.5–5.3)
Sodium: 138 mEq/L (ref 135–145)
Total Protein: 7.1 g/dL (ref 6.0–8.3)

## 2012-07-17 LAB — CBC
HCT: 39.6 % (ref 39.0–52.0)
Hemoglobin: 13.8 g/dL (ref 13.0–17.0)
MCH: 29.5 pg (ref 26.0–34.0)
MCHC: 34.8 g/dL (ref 30.0–36.0)
MCV: 84.6 fL (ref 78.0–100.0)
Platelets: 209 10*3/uL (ref 150–400)
RBC: 4.68 MIL/uL (ref 4.22–5.81)
RDW: 13.9 % (ref 11.5–15.5)
WBC: 5.3 10*3/uL (ref 4.0–10.5)

## 2012-07-17 LAB — POCT GLYCOSYLATED HEMOGLOBIN (HGB A1C): Hemoglobin A1C: 5.7

## 2012-07-17 LAB — TSH: TSH: 1.556 u[IU]/mL (ref 0.350–4.500)

## 2012-07-17 MED ORDER — PREDNISONE (PAK) 10 MG PO TABS: 10.0000 mg | ORAL_TABLET | Freq: Every day | ORAL | Status: DC

## 2012-07-17 NOTE — Progress Notes (Signed)
  Subjective:    CC: Followup  HPI: Glucose intolerance: History of diabetes mellitus type 2, has lost over 100 pounds, and is now no longer diabetic.  Hypertension: Has been using one quarter of a lisinopril/hydrochlorothiazide tablet, and blood pressure is currently 90/60. He is symptomatic mild orthostasis with rapid changes in position.  Lung nodule: This was noted on CT scan back in 2009 were a pulmonary embolism was diagnosed. Unfortunately repeat CT scan was recommended at 12 months, this was never performed. He is a nonsmoker.  Bilateral knee osteoarthritis: Status post bilateral total knee arthroplasty.  Eczematous dermatitis: Uses prednisone taper occasionally when rash is present, they're not in the distribution that is realistic to cover with a cream.   obstructive uropathy: well controlled with finasteride.  Past medical history, Surgical history, Family history not pertinant except as noted below, Social history, Allergies, and medications have been entered into the medical record, reviewed, and no changes needed.   Review of Systems: No fevers, chills, night sweats, weight loss, chest pain, or shortness of breath.   Objective:    General: Well Developed, well nourished, and in no acute distress.  Neuro: Alert and oriented x3, extra-ocular muscles intact, sensation grossly intact.  HEENT: Normocephalic, atraumatic, pupils equal round reactive to light, neck supple, no masses, no lymphadenopathy, thyroid nonpalpable.  Skin: Warm and dry, no rashes. Cardiac: Regular rate and rhythm, no murmurs rubs or gallops, no lower extremity edema.  Respiratory: Clear to auscultation bilaterally. Not using accessory muscles, speaking in full sentences. Impression and Recommendations:

## 2012-07-17 NOTE — Assessment & Plan Note (Signed)
Extremely well controlled, continue finasteride.

## 2012-07-17 NOTE — Assessment & Plan Note (Signed)
Repeat chest CT has not been done for lung nodules. Ordering.

## 2012-07-17 NOTE — Assessment & Plan Note (Signed)
Blood sugars are overall very well controlled. He is out of the prediabetic range.

## 2012-07-17 NOTE — Assessment & Plan Note (Signed)
Filling prednisone.

## 2012-07-17 NOTE — Assessment & Plan Note (Signed)
Zostavax given. Checking routine blood work.

## 2012-07-17 NOTE — Assessment & Plan Note (Signed)
Blood pressure continues to be low on the very small blood pressure dose. I'm discontinuing all blood pressure medications, he'll come back in 2 weeks to recheck. Her blood pressure is elevated we will start lisinopril at 10 mg.

## 2012-07-18 LAB — TESTOSTERONE, FREE, TOTAL, SHBG
Sex Hormone Binding: 56 nmol/L (ref 13–71)
Testosterone, Free: 85.5 pg/mL (ref 47.0–244.0)
Testosterone-% Free: 1.5 % — ABNORMAL LOW (ref 1.6–2.9)
Testosterone: 570 ng/dL (ref 300–890)

## 2012-07-18 LAB — VITAMIN D 25 HYDROXY (VIT D DEFICIENCY, FRACTURES): Vit D, 25-Hydroxy: 35 ng/mL (ref 30–89)

## 2012-07-23 ENCOUNTER — Telehealth: Payer: Self-pay | Admitting: *Deleted

## 2012-07-23 NOTE — Telephone Encounter (Signed)
Prior auth obtained for CT Chest. Auth # [id number]. Barry Dienes, LPN

## 2012-07-25 ENCOUNTER — Ambulatory Visit (INDEPENDENT_AMBULATORY_CARE_PROVIDER_SITE_OTHER): Payer: BC Managed Care – PPO

## 2012-07-25 DIAGNOSIS — J984 Other disorders of lung: Secondary | ICD-10-CM

## 2012-08-09 ENCOUNTER — Encounter: Payer: Self-pay | Admitting: Sports Medicine

## 2012-08-09 ENCOUNTER — Ambulatory Visit (INDEPENDENT_AMBULATORY_CARE_PROVIDER_SITE_OTHER): Payer: BC Managed Care – PPO | Admitting: Sports Medicine

## 2012-08-09 VITALS — BP 118/69 | HR 58 | Wt 153.0 lb

## 2012-08-09 DIAGNOSIS — Z299 Encounter for prophylactic measures, unspecified: Secondary | ICD-10-CM

## 2012-08-09 NOTE — Assessment & Plan Note (Signed)
There is mild fasting glucose intolerance, but he's no longer diabetic. I discontinued his blood pressure medication, blood pressure is well controlled and he feels a lot better. Return to see me in 6 months.

## 2012-08-09 NOTE — Progress Notes (Signed)
  Subjective:    CC: Follow up  HPI: Hypertension: I discontinued all blood pressure medication at the last visit, all blood pressures and typically run in the one teens to 130s.  Hyperlipidemia: Stable on current medications.  Diabetes mellitus type 2: Resolved, only mild glucose intolerance after 100+ pound weight loss.  Past medical history, Surgical history, Family history not pertinant except as noted below, Social history, Allergies, and medications have been entered into the medical record, reviewed, and no changes needed.   Review of Systems: No fevers, chills, night sweats, weight loss, chest pain, or shortness of breath.   Objective:    General: Well Developed, well nourished, and in no acute distress.  Neuro: Alert and oriented x3, extra-ocular muscles intact, sensation grossly intact.  HEENT: Normocephalic, atraumatic, pupils equal round reactive to light, neck supple, no masses, no lymphadenopathy, thyroid nonpalpable.  Skin: Warm and dry, no rashes. Cardiac: Regular rate and rhythm, no murmurs rubs or gallops, no lower extremity edema.  Respiratory: Clear to auscultation bilaterally. Not using accessory muscles, speaking in full sentences. Impression and Recommendations:

## 2012-11-11 ENCOUNTER — Ambulatory Visit (INDEPENDENT_AMBULATORY_CARE_PROVIDER_SITE_OTHER): Payer: BC Managed Care – PPO | Admitting: Sports Medicine

## 2012-11-11 ENCOUNTER — Encounter: Payer: Self-pay | Admitting: Sports Medicine

## 2012-11-11 VITALS — BP 114/65 | HR 63 | Wt 155.0 lb

## 2012-11-11 DIAGNOSIS — Z299 Encounter for prophylactic measures, unspecified: Secondary | ICD-10-CM

## 2012-11-11 DIAGNOSIS — F4321 Adjustment disorder with depressed mood: Secondary | ICD-10-CM

## 2012-11-11 DIAGNOSIS — Z23 Encounter for immunization: Secondary | ICD-10-CM

## 2012-11-11 MED ORDER — CITALOPRAM HYDROBROMIDE 20 MG PO TABS: 20.0000 mg | ORAL_TABLET | Freq: Every day | ORAL | Status: DC

## 2012-11-11 NOTE — Progress Notes (Signed)
  Subjective:    CC: Fatigue  HPI: This is a very pleasant 62 year old male, for the past several weeks he's been feeling depressed mood, poor concentration, poor sleep, increased appetite, no suicidal or supple ideation, poor interest, no feelings of guilt. He does tell me that he has experienced this since January when his granddaughter was killed in a motor vehicle accident. Currently they're setting her estate affairs in order, and this is creating extra stress. Symptoms are moderate, persistent.  Past medical history, Surgical history, Family history not pertinant except as noted below, Social history, Allergies, and medications have been entered into the medical record, reviewed, and no changes needed.   Review of Systems: No fevers, chills, night sweats, weight loss, chest pain, or shortness of breath.   Objective:    General: Well Developed, well nourished, and in no acute distress.  Neuro: Alert and oriented x3, extra-ocular muscles intact, sensation grossly intact.  HEENT: Normocephalic, atraumatic, pupils equal round reactive to light, neck supple, no masses, no lymphadenopathy, thyroid nonpalpable.  Skin: Warm and dry, no rashes. Cardiac: Regular rate and rhythm, no murmurs rubs or gallops, no lower extremity edema.  Respiratory: Clear to auscultation bilaterally. Not using accessory muscles, speaking in full sentences. Impression and Recommendations:

## 2012-11-11 NOTE — Assessment & Plan Note (Signed)
Influenza vaccine as above. 

## 2012-11-11 NOTE — Assessment & Plan Note (Addendum)
This occurred after the death of his granddaughter in 02-20-2022. Checking CBC, CMET, TSH, testosterone to rule these out as possible causes of his symptoms. He's been working with her estate. Citalopram 20 mg at bedtime to help him through this process, if he gets drowsy we can certainly try Wellbutrin.

## 2012-11-12 DIAGNOSIS — Z23 Encounter for immunization: Secondary | ICD-10-CM

## 2012-11-12 LAB — COMPREHENSIVE METABOLIC PANEL WITH GFR
ALT: 12 U/L (ref 0–53)
AST: 16 U/L (ref 0–37)
CO2: 29 meq/L (ref 19–32)
Calcium: 9.8 mg/dL (ref 8.4–10.5)
Chloride: 99 meq/L (ref 96–112)
Creat: 0.65 mg/dL (ref 0.50–1.35)
Sodium: 139 meq/L (ref 135–145)
Total Bilirubin: 0.6 mg/dL (ref 0.3–1.2)
Total Protein: 7.9 g/dL (ref 6.0–8.3)

## 2012-11-12 LAB — COMPREHENSIVE METABOLIC PANEL
Albumin: 4.2 g/dL (ref 3.5–5.2)
Alkaline Phosphatase: 58 U/L (ref 39–117)
BUN: 14 mg/dL (ref 6–23)
Glucose, Bld: 130 mg/dL — ABNORMAL HIGH (ref 70–99)
Potassium: 3.9 mEq/L (ref 3.5–5.3)

## 2012-11-12 LAB — CBC
HCT: 41.3 % (ref 39.0–52.0)
Hemoglobin: 14 g/dL (ref 13.0–17.0)
MCH: 28.6 pg (ref 26.0–34.0)
MCHC: 33.9 g/dL (ref 30.0–36.0)
MCV: 84.5 fL (ref 78.0–100.0)
Platelets: 254 K/uL (ref 150–400)
RBC: 4.89 MIL/uL (ref 4.22–5.81)
RDW: 13.9 % (ref 11.5–15.5)
WBC: 5.7 K/uL (ref 4.0–10.5)

## 2012-11-12 LAB — TESTOSTERONE, FREE, TOTAL, SHBG
Sex Hormone Binding: 53 nmol/L (ref 13–71)
Testosterone, Free: 53.7 pg/mL (ref 47.0–244.0)
Testosterone-% Free: 1.5 % — ABNORMAL LOW (ref 1.6–2.9)
Testosterone: 366 ng/dL (ref 300–890)

## 2012-11-12 LAB — TSH: TSH: 1.702 u[IU]/mL (ref 0.350–4.500)

## 2012-11-25 ENCOUNTER — Ambulatory Visit: Payer: BC Managed Care – PPO | Admitting: Sports Medicine

## 2013-01-24 ENCOUNTER — Encounter: Payer: Self-pay | Admitting: Sports Medicine

## 2013-01-24 ENCOUNTER — Ambulatory Visit (INDEPENDENT_AMBULATORY_CARE_PROVIDER_SITE_OTHER): Payer: BC Managed Care – PPO | Admitting: Sports Medicine

## 2013-01-24 ENCOUNTER — Ambulatory Visit (HOSPITAL_BASED_OUTPATIENT_CLINIC_OR_DEPARTMENT_OTHER)
Admission: RE | Admit: 2013-01-24 | Discharge: 2013-01-24 | Disposition: A | Discharge status: Home / Self Care | Payer: BC Managed Care – PPO | Source: Ambulatory Visit | Attending: Sports Medicine | Admitting: Sports Medicine

## 2013-01-24 ENCOUNTER — Ambulatory Visit (INDEPENDENT_AMBULATORY_CARE_PROVIDER_SITE_OTHER): Payer: BC Managed Care – PPO

## 2013-01-24 VITALS — BP 133/75 | HR 63 | Wt 155.0 lb

## 2013-01-24 DIAGNOSIS — R791 Abnormal coagulation profile: Secondary | ICD-10-CM | POA: Insufficient documentation

## 2013-01-24 DIAGNOSIS — R079 Chest pain, unspecified: Secondary | ICD-10-CM

## 2013-01-24 DIAGNOSIS — R0781 Pleurodynia: Secondary | ICD-10-CM

## 2013-01-24 DIAGNOSIS — R071 Chest pain on breathing: Secondary | ICD-10-CM | POA: Insufficient documentation

## 2013-01-24 DIAGNOSIS — R918 Other nonspecific abnormal finding of lung field: Secondary | ICD-10-CM | POA: Insufficient documentation

## 2013-01-24 DIAGNOSIS — R0989 Other specified symptoms and signs involving the circulatory and respiratory systems: Secondary | ICD-10-CM

## 2013-01-24 DIAGNOSIS — J984 Other disorders of lung: Secondary | ICD-10-CM | POA: Insufficient documentation

## 2013-01-24 DIAGNOSIS — M069 Rheumatoid arthritis, unspecified: Secondary | ICD-10-CM | POA: Insufficient documentation

## 2013-01-24 DIAGNOSIS — Z86711 Personal history of pulmonary embolism: Secondary | ICD-10-CM | POA: Insufficient documentation

## 2013-01-24 LAB — CBC WITH DIFFERENTIAL/PLATELET
Basophils Absolute: 0 K/uL (ref 0.0–0.1)
Basophils Relative: 0 % (ref 0–1)
Eosinophils Absolute: 0.1 10*3/uL (ref 0.0–0.7)
Eosinophils Relative: 1 % (ref 0–5)
HCT: 40.8 % (ref 39.0–52.0)
Hemoglobin: 13.9 g/dL (ref 13.0–17.0)
Lymphocytes Relative: 15 % (ref 12–46)
Lymphs Abs: 1.3 10*3/uL (ref 0.7–4.0)
MCH: 28.9 pg (ref 26.0–34.0)
MCHC: 34.1 g/dL (ref 30.0–36.0)
MCV: 84.8 fL (ref 78.0–100.0)
Monocytes Absolute: 0.8 K/uL (ref 0.1–1.0)
Monocytes Relative: 9 % (ref 3–12)
Neutro Abs: 6.3 10*3/uL (ref 1.7–7.7)
Neutrophils Relative %: 75 % (ref 43–77)
Platelets: 281 10*3/uL (ref 150–400)
RBC: 4.81 MIL/uL (ref 4.22–5.81)
RDW: 14.2 % (ref 11.5–15.5)
WBC: 8.5 10*3/uL (ref 4.0–10.5)

## 2013-01-24 LAB — COMPREHENSIVE METABOLIC PANEL
ALT: 12 U/L (ref 0–53)
Albumin: 3.8 g/dL (ref 3.5–5.2)
CO2: 31 mEq/L (ref 19–32)
Calcium: 10 mg/dL (ref 8.4–10.5)
Chloride: 100 mEq/L (ref 96–112)
Potassium: 4.3 mEq/L (ref 3.5–5.3)
Sodium: 137 mEq/L (ref 135–145)
Total Bilirubin: 0.5 mg/dL (ref 0.3–1.2)
Total Protein: 7.4 g/dL (ref 6.0–8.3)

## 2013-01-24 LAB — D-DIMER, QUANTITATIVE: D-Dimer, Quant: 1.07 ug/mL-FEU — ABNORMAL HIGH (ref 0.00–0.48)

## 2013-01-24 LAB — COMPREHENSIVE METABOLIC PANEL WITH GFR
AST: 15 U/L (ref 0–37)
Alkaline Phosphatase: 50 U/L (ref 39–117)
BUN: 13 mg/dL (ref 6–23)
Creat: 0.75 mg/dL (ref 0.50–1.35)
Glucose, Bld: 112 mg/dL — ABNORMAL HIGH (ref 70–99)

## 2013-01-24 MED ORDER — IOHEXOL 350 MG/ML SOLN
80.0000 mL | Freq: Once | INTRAVENOUS | Status: AC | PRN
  Administered 2013-01-24: 80 mL via INTRAVENOUS

## 2013-01-24 MED ORDER — AZITHROMYCIN 250 MG PO TABS: ORAL_TABLET | ORAL | Status: DC

## 2013-01-24 MED ORDER — PREDNISONE (PAK) 10 MG PO TABS: ORAL_TABLET | ORAL | Status: DC

## 2013-01-24 NOTE — Assessment & Plan Note (Addendum)
Symptoms are likely represents pleuritis. I'm going to treat him aggressively with prednisone taper, azithromycin. Chest x-ray, d-dimer, CT and she was negative. Like to see him back in about a week to see how things are going.  It sounds as though he did have some rheumatologic blood work done at [facility], combined with fusiform swelling at his metacarpophalangeal joints and pleuritis, need to make sure a complete panel for rheumatologic disease was done.  D-dimer is elevated, combined with pleuritic chest pain and history of PE, we are going to obtain a stat CT angiogram of the pulmonary arteries.

## 2013-01-24 NOTE — Addendum Note (Signed)
Addended by: Silverio Decamp on: 01/24/2013 01:42 PM   Modules accepted: Orders

## 2013-01-24 NOTE — Progress Notes (Signed)
  Subjective:    CC: Chest wall pain  HPI: This is a very pleasant 63 year old male who comes in with a 4-5 month history of pleuritic type chest pain in his left mid chest wall. He does recall a low-grade temperature of 99.27F about 3-4 weeks ago. Since then he's had intermittent pain with taking deep breaths, no radiation, moderate, no shortness of breath, no other constitutional symptoms with the exception of occasional fatigue.  Past medical history, Surgical history, Family history not pertinant except as noted below, Social history, Allergies, and medications have been entered into the medical record, reviewed, and no changes needed.   Review of Systems: No fevers, chills, night sweats, weight loss, chest pain, or shortness of breath.   Objective:    General: Well Developed, well nourished, and in no acute distress.  Neuro: Alert and oriented x3, extra-ocular muscles intact, sensation grossly intact.  HEENT: Normocephalic, atraumatic, pupils equal round reactive to light, neck supple, no masses, no lymphadenopathy, thyroid nonpalpable.  Skin: Warm and dry, no rashes. Cardiac: Regular rate and rhythm, no murmurs rubs or gallops, no lower extremity edema.  Respiratory: There is a fairly significant pleural rub heard bilaterally, left worse than right. Not using accessory muscles, speaking in full sentences.  Impression and Recommendations:

## 2013-01-31 ENCOUNTER — Encounter: Payer: Self-pay | Admitting: Sports Medicine

## 2013-01-31 ENCOUNTER — Ambulatory Visit (INDEPENDENT_AMBULATORY_CARE_PROVIDER_SITE_OTHER): Payer: BC Managed Care – PPO | Admitting: Sports Medicine

## 2013-01-31 VITALS — BP 136/70 | HR 68 | Wt 154.0 lb

## 2013-01-31 DIAGNOSIS — K219 Gastro-esophageal reflux disease without esophagitis: Secondary | ICD-10-CM

## 2013-01-31 DIAGNOSIS — M6530 Trigger finger, unspecified finger: Secondary | ICD-10-CM

## 2013-01-31 DIAGNOSIS — M069 Rheumatoid arthritis, unspecified: Secondary | ICD-10-CM

## 2013-01-31 NOTE — Assessment & Plan Note (Addendum)
Improved with course of prednisone after rheumatoid flare. 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 He does have an appointment in March with a rheumatologist in [place]. I'm happy to do occasional courses of prednisone when he does have flares in his rheumatism. I'm going to do a referral to a rheumatologist up the road here in [place]. He will need to have his blood work forwarded.

## 2013-01-31 NOTE — Progress Notes (Signed)
  Subjective:    CC: Followup  HPI: Omar Marsh returns for followup of a pleural friction rub we heard at the last visit. He was in the process of a rheumatoid workup. Since then he tells me he does have a diagnosis now of rheumatoid arthritis. We started him on high-dose prednisone and his pleural rub as well as any chest pain he had has completely resolved. The stiffness in his hands and fingers is also improved significantly to the point where he is now starting to notice triggering in his left fourth finger as he can now take it to the range of motion. We discussed this in the past, and he is amenable to interventional treatment. He also has a referral now to rheumatology but this will be sometime in March. He wonders if we can do a rheumatologist here as well with a sooner referral. Pain in his hand he is moderate, persistent, triggering his frequent.  Past medical history, Surgical history, Family history not pertinant except as noted below, Social history, Allergies, and medications have been entered into the medical record, reviewed, and no changes needed.   Review of Systems: No fevers, chills, night sweats, weight loss, chest pain, or shortness of breath.   Objective:    General: Well Developed, well nourished, and in no acute distress.  Neuro: Alert and oriented x3, extra-ocular muscles intact, sensation grossly intact.  HEENT: Normocephalic, atraumatic, pupils equal round reactive to light, neck supple, no masses, no lymphadenopathy, thyroid nonpalpable.  Skin: Warm and dry, no rashes. Cardiac: Regular rate and rhythm, no murmurs rubs or gallops, no lower extremity edema.  Respiratory: Clear to auscultation bilaterally. Not using accessory muscles, speaking in full sentences. Left hand: Palpable nodule just proximal to the A1 pulley, triggering occurs with active range of motion.  Procedure: Real-time Ultrasound Guided Injection of left fourth flexor tendon sheath Device: GE Logiq E    Verbal informed consent obtained.  Time-out conducted.  Noted no overlying erythema, induration, or other signs of local infection.  Skin prepped in a sterile fashion.  Local anesthesia: Topical Ethyl chloride.  With sterile technique and under real time ultrasound guidance:  25-gauge needle advanced into the tendon sheath, a total of 0.5 cc Kenalog 40, 1 cc lidocaine injected easily. Completed without difficulty  Pain immediately resolved suggesting accurate placement of the medication.  Advised to call if fevers/chills, erythema, induration, drainage, or persistent bleeding.  Images permanently stored and available for review in the ultrasound unit.  Impression: Technically successful ultrasound guided injection.  Impression and Recommendations:

## 2013-01-31 NOTE — Assessment & Plan Note (Addendum)
Ultrasound guided injection placed into the left fourth flexor tendon sheath. Return in one month.

## 2013-02-10 ENCOUNTER — Ambulatory Visit: Payer: BC Managed Care – PPO | Admitting: Sports Medicine

## 2013-03-03 ENCOUNTER — Encounter: Payer: Self-pay | Admitting: Sports Medicine

## 2013-03-03 ENCOUNTER — Ambulatory Visit (INDEPENDENT_AMBULATORY_CARE_PROVIDER_SITE_OTHER): Payer: BC Managed Care – PPO | Admitting: Sports Medicine

## 2013-03-03 ENCOUNTER — Ambulatory Visit (INDEPENDENT_AMBULATORY_CARE_PROVIDER_SITE_OTHER): Payer: BC Managed Care – PPO

## 2013-03-03 VITALS — BP 121/74 | HR 71 | Ht 65.0 in | Wt 156.0 lb

## 2013-03-03 DIAGNOSIS — R059 Cough, unspecified: Secondary | ICD-10-CM

## 2013-03-03 DIAGNOSIS — M6530 Trigger finger, unspecified finger: Secondary | ICD-10-CM

## 2013-03-03 DIAGNOSIS — R05 Cough: Secondary | ICD-10-CM

## 2013-03-03 DIAGNOSIS — R0989 Other specified symptoms and signs involving the circulatory and respiratory systems: Secondary | ICD-10-CM

## 2013-03-03 MED ORDER — PREDNISONE 5 MG PO TABS: 5.0000 mg | ORAL_TABLET | Freq: Two times a day (BID) | ORAL | Status: DC

## 2013-03-03 MED ORDER — BENZONATATE 200 MG PO CAPS: 200.0000 mg | ORAL_CAPSULE | Freq: Three times a day (TID) | ORAL | Status: DC | PRN

## 2013-03-03 NOTE — Progress Notes (Signed)
  Subjective:    CC: Followup  HPI: Trigger finger: Resolved after injection.  Cough: Mild, persistent, nonproductive, some left ear pain, no sinus pain or pressure, no nasal discharge. No shortness of breath or chest pain.  Past medical history, Surgical history, Family history not pertinant except as noted below, Social history, Allergies, and medications have been entered into the medical record, reviewed, and no changes needed.   Review of Systems: No fevers, chills, night sweats, weight loss, chest pain, or shortness of breath.   Objective:    General: Well Developed, well nourished, and in no acute distress.  Neuro: Alert and oriented x3, extra-ocular muscles intact, sensation grossly intact.  HEENT: Normocephalic, atraumatic, pupils equal round reactive to light, neck supple, no masses, no lymphadenopathy, thyroid nonpalpable. Oropharynx, nasopharynx, external ear canals are unremarkable. Skin: Warm and dry, no rashes. Cardiac: Regular rate and rhythm, no murmurs rubs or gallops, no lower extremity edema.  Respiratory: Mild crackles in the right upper lobe. Not using accessory muscles, speaking in full sentences.  Impression and Recommendations:

## 2013-03-03 NOTE — Patient Instructions (Signed)
Increase prednisone to 10 mg twice a day for 5 days, then drop back down to 5 mg twice a day.

## 2013-03-03 NOTE — Assessment & Plan Note (Signed)
Resolved with injection.  

## 2013-03-03 NOTE — Assessment & Plan Note (Signed)
Continue over-the-counter analgesics. He did have some crackles in the right middle and right upper lobes, chest x-ray. Increase prednisone to 10 mg twice a day for 5 days then go back to 5 mg twice a day. Tessalon Perles for cough. Return as needed.

## 2013-03-24 ENCOUNTER — Ambulatory Visit: Payer: BC Managed Care – PPO | Admitting: Sports Medicine

## 2013-05-22 ENCOUNTER — Ambulatory Visit (INDEPENDENT_AMBULATORY_CARE_PROVIDER_SITE_OTHER): Payer: BC Managed Care – PPO

## 2013-05-22 ENCOUNTER — Other Ambulatory Visit: Payer: Self-pay | Admitting: Sports Medicine

## 2013-05-22 ENCOUNTER — Ambulatory Visit (HOSPITAL_BASED_OUTPATIENT_CLINIC_OR_DEPARTMENT_OTHER)
Admission: RE | Admit: 2013-05-22 | Discharge: 2013-05-22 | Disposition: A | Discharge status: Home / Self Care | Payer: BC Managed Care – PPO | Source: Ambulatory Visit | Attending: Sports Medicine | Admitting: Sports Medicine

## 2013-05-22 ENCOUNTER — Telehealth: Payer: Self-pay | Admitting: *Deleted

## 2013-05-22 ENCOUNTER — Ambulatory Visit (INDEPENDENT_AMBULATORY_CARE_PROVIDER_SITE_OTHER): Payer: BC Managed Care – PPO | Admitting: Sports Medicine

## 2013-05-22 ENCOUNTER — Encounter: Payer: Self-pay | Admitting: Sports Medicine

## 2013-05-22 VITALS — BP 134/76 | HR 88 | Wt 158.0 lb

## 2013-05-22 DIAGNOSIS — M5380 Other specified dorsopathies, site unspecified: Secondary | ICD-10-CM

## 2013-05-22 DIAGNOSIS — N200 Calculus of kidney: Secondary | ICD-10-CM

## 2013-05-22 DIAGNOSIS — R911 Solitary pulmonary nodule: Secondary | ICD-10-CM

## 2013-05-22 DIAGNOSIS — M5137 Other intervertebral disc degeneration, lumbosacral region: Secondary | ICD-10-CM

## 2013-05-22 DIAGNOSIS — M549 Dorsalgia, unspecified: Secondary | ICD-10-CM

## 2013-05-22 DIAGNOSIS — M51379 Other intervertebral disc degeneration, lumbosacral region without mention of lumbar back pain or lower extremity pain: Secondary | ICD-10-CM

## 2013-05-22 DIAGNOSIS — M5136 Other intervertebral disc degeneration, lumbar region: Secondary | ICD-10-CM | POA: Insufficient documentation

## 2013-05-22 DIAGNOSIS — M51369 Other intervertebral disc degeneration, lumbar region without mention of lumbar back pain or lower extremity pain: Secondary | ICD-10-CM | POA: Insufficient documentation

## 2013-05-22 DIAGNOSIS — M47817 Spondylosis without myelopathy or radiculopathy, lumbosacral region: Secondary | ICD-10-CM

## 2013-05-22 DIAGNOSIS — K4020 Bilateral inguinal hernia, without obstruction or gangrene, not specified as recurrent: Secondary | ICD-10-CM

## 2013-05-22 LAB — POCT URINALYSIS DIPSTICK
Bilirubin, UA: NEGATIVE
Glucose, UA: NEGATIVE
Ketones, UA: NEGATIVE
Leukocytes, UA: NEGATIVE
Nitrite, UA: NEGATIVE
Protein, UA: NEGATIVE
Spec Grav, UA: 1.02
Urobilinogen, UA: 0.2
pH, UA: 7.5

## 2013-05-22 MED ORDER — TRAMADOL HCL 50 MG PO TABS: ORAL_TABLET | ORAL | Status: DC

## 2013-05-22 MED ORDER — SILODOSIN 4 MG PO CAPS: 4.0000 mg | ORAL_CAPSULE | Freq: Every day | ORAL | Status: DC

## 2013-05-22 MED ORDER — PREDNISONE 50 MG PO TABS: ORAL_TABLET | ORAL | Status: DC

## 2013-05-22 NOTE — Telephone Encounter (Signed)
PA obtained for CT ABD/Pelvis w/o contrast. Auth # [id number].  Oscar La, LPN

## 2013-05-22 NOTE — Assessment & Plan Note (Signed)
Low-back pain is most likely due to lumbar degenerative disc disease. He does have a spine surgeon that can take over care, I am going to increase his prednisone to 50 mg for 5 days, and we are going to add formal physical therapy.

## 2013-05-22 NOTE — Assessment & Plan Note (Addendum)
He did have a small amount of blood in his urine, combined with back pain, this may represent nephrolithiasis. I am going to obtain a CT without contrast to further elucidate a Possible kidney stone. He will strain his urine, I am adding rapaflo, and tramadol for pain.  CT scan negative for nephrolithiasis however he did have hematuria so we need to keep an eye on this. Discontinue rapaflo. We can recheck his urine in about a month, if persistent hematuria he would likely need cystoscopy.

## 2013-05-22 NOTE — Progress Notes (Signed)
Patient ID: Omar Marsh, male   DOB: 1950-11-21, 63 y.o.   MRN: [id number]  Subjective:    CC: Acute low back pain  HPI: Patient presents today with the acute complaint of "kidney stone".  He states that he woke up last night around 2 a.m with fairly severe lower back pain.  He was able to fall back asleep until 5 am when he awoke in severe pain and felt that something "wasn't right".  He has since had waxing and waning bilateral lower back pain, maybe worse on the left. The pain also radiates to his groin occasionally.  He also noted that he had difficulty urinating this morning however he did not notice any bloody or dark colored urine.  He did not have any numbness or tingling in his groin.  He also has not had any pain or numbness in his legs.  Pain is not worse with coughing, leaning forward, straining, or prolonged sitting. Denies any fever or chills.   Past medical history, Surgical history, Family history not pertinant except as noted below, Social history, Allergies, and medications have been entered into the medical record, reviewed, and no changes needed.   Review of Systems: No fevers, chills, night sweats, weight loss, chest pain, or shortness of breath.   Objective:    General: Well Developed, well nourished, and in no acute distress.  Neuro: Alert and oriented x3, extra-ocular muscles intact, sensation grossly intact.  HEENT: Normocephalic, atraumatic, pupils equal round reactive to light, neck supple, no masses, no lymphadenopathy, thyroid nonpalpable.  Skin: Warm and dry, no rashes. Cardiac: Regular rate and rhythm, no murmurs rubs or gallops, no lower extremity edema.  Respiratory: Clear to auscultation bilaterally. Not using accessory muscles, speaking in full sentences. Back Exam:  Inspection: Unremarkable  Motion: Flexion 45 deg, Extension 45 deg, Side Bending to 45 deg bilaterally,  Rotation to 45 deg bilaterally  SLR laying: Negative  XSLR laying: Negative    Palpable tenderness: None. FABER: negative. Sensory change: Gross sensation intact to all lumbar and sacral dermatomes.  Reflexes: 2+ at both patellar tendons, 2+ at achilles tendons, Babinski's downgoing.  Strength at foot  Plantar-flexion: 5/5 Dorsi-flexion: 5/5 Eversion: 5/5 Inversion: 5/5  Leg strength  Quad: 5/5 Hamstring: 5/5 Hip flexor: 5/5 Hip abductors: 5/5  Gait unremarkable.  Spot UA positive for small blood in urine  CT of the abdomen and pelvis does not show any explanation for hematuria, he does have severe L4-5 and L5-S1 degenerative disc disease.  Impression and Recommendations:    Patient with h/o nephrolithiasis as well as RA and lumbar DJD presents with acute onset lower back pain x several hours.  Physical exam is completely benign however UA showed small amount of blood in his urine.  Symptoms could represent either nephrolithiasis or lumbar DJD given vague history and physical exam with bilateral back pain.  - CT w/o contrast for possible nephrolithiasis  - Rapiflo - Strain for stone - Tramadol for pain

## 2013-06-05 ENCOUNTER — Ambulatory Visit: Payer: BC Managed Care – PPO | Admitting: Sports Medicine

## 2014-01-14 ENCOUNTER — Ambulatory Visit: Payer: BC Managed Care – PPO | Admitting: Sports Medicine

## 2014-01-22 ENCOUNTER — Ambulatory Visit (INDEPENDENT_AMBULATORY_CARE_PROVIDER_SITE_OTHER): Payer: BLUE CROSS/BLUE SHIELD | Admitting: Sports Medicine

## 2014-01-22 ENCOUNTER — Encounter: Payer: Self-pay | Admitting: Sports Medicine

## 2014-01-22 VITALS — BP 136/69 | HR 60 | Ht 65.0 in | Wt 153.0 lb

## 2014-01-22 DIAGNOSIS — R7302 Impaired glucose tolerance (oral): Secondary | ICD-10-CM

## 2014-01-22 DIAGNOSIS — R7301 Impaired fasting glucose: Secondary | ICD-10-CM

## 2014-01-22 DIAGNOSIS — J0100 Acute maxillary sinusitis, unspecified: Secondary | ICD-10-CM

## 2014-01-22 LAB — POCT GLYCOSYLATED HEMOGLOBIN (HGB A1C): Hemoglobin A1C: 6

## 2014-01-22 MED ORDER — AZITHROMYCIN 250 MG PO TABS: ORAL_TABLET | ORAL | Status: DC

## 2014-01-22 MED ORDER — FLUTICASONE PROPIONATE 50 MCG/ACT NA SUSP: NASAL | Status: DC

## 2014-01-22 NOTE — Assessment & Plan Note (Addendum)
Hemoglobin A1c continues to be essentially stable, continues to be out of the diabetic range.

## 2014-01-22 NOTE — Addendum Note (Signed)
Addended by: Doree Albee on: 01/22/2014 05:18 PM   Modules accepted: Orders

## 2014-01-22 NOTE — Assessment & Plan Note (Signed)
Azithromycin, Flonase. Return as needed. 

## 2014-01-22 NOTE — Progress Notes (Signed)
  Subjective:    CC: Followup  HPI: Sinus infection:  For the past few days has had pressure, pain over left maxillary sinus.  Nasal discharge and watery eyes.  Symptoms are moderate, persistent. He is using oral antihistamines. Has not tried nasal steroids.  Glucose intolerance: Has been well controlled. Would like A1c rechecked today.,  Past medical history, Surgical history, Family history not pertinant except as noted below, Social history, Allergies, and medications have been entered into the medical record, reviewed, and no changes needed.   Review of Systems: No fevers, chills, night sweats, weight loss, chest pain, or shortness of breath.   Objective:    General: Well Developed, well nourished, and in no acute distress.  Neuro: Alert and oriented x3, extra-ocular muscles intact, sensation grossly intact.  HEENT: Normocephalic, atraumatic, pupils equal round reactive to light, neck supple, no masses, no lymphadenopathy, thyroid nonpalpable. Tender to palpation over the left maxillary sinus, oropharynx, nasopharynx, ear canals are unremarkable. Skin: Warm and dry, no rashes. Cardiac: Regular rate and rhythm, no murmurs rubs or gallops, no lower extremity edema.  Respiratory: Clear to auscultation bilaterally. Not using accessory muscles, speaking in full sentences.  Impression and Recommendations:

## 2014-07-23 ENCOUNTER — Ambulatory Visit: Payer: BLUE CROSS/BLUE SHIELD | Admitting: Sports Medicine

## 2014-08-17 ENCOUNTER — Encounter: Payer: Self-pay | Admitting: Sports Medicine

## 2014-08-17 ENCOUNTER — Ambulatory Visit (INDEPENDENT_AMBULATORY_CARE_PROVIDER_SITE_OTHER): Payer: Medicare Other | Admitting: Sports Medicine

## 2014-08-17 VITALS — BP 135/74 | HR 61 | Ht 65.0 in | Wt 145.0 lb

## 2014-08-17 DIAGNOSIS — R7302 Impaired glucose tolerance (oral): Secondary | ICD-10-CM | POA: Diagnosis not present

## 2014-08-17 DIAGNOSIS — Z23 Encounter for immunization: Secondary | ICD-10-CM | POA: Diagnosis not present

## 2014-08-17 DIAGNOSIS — R7301 Impaired fasting glucose: Secondary | ICD-10-CM

## 2014-08-17 DIAGNOSIS — Z299 Encounter for prophylactic measures, unspecified: Secondary | ICD-10-CM

## 2014-08-17 DIAGNOSIS — R7309 Other abnormal glucose: Secondary | ICD-10-CM

## 2014-08-17 LAB — POCT GLYCOSYLATED HEMOGLOBIN (HGB A1C): Hemoglobin A1C: 5.6

## 2014-08-17 NOTE — Assessment & Plan Note (Addendum)
Pneumococcal vaccine today.

## 2014-08-17 NOTE — Assessment & Plan Note (Signed)
Hemoglobin A1c has improved even further, he is out of the prediabetic range and into the normal range.

## 2014-08-17 NOTE — Progress Notes (Signed)
  Subjective:    CC: Follow-up  HPI: Glucose intolerance: Brylin was a diabetic, more recently his A1c's have remained in the prediabetic range, with continued weight loss he is here today to recheck. He is asymptomatic.  Preventive measures: Due for a pneumococcal 13.  Past medical history, Surgical history, Family history not pertinant except as noted below, Social history, Allergies, and medications have been entered into the medical record, reviewed, and no changes needed.   Review of Systems: No fevers, chills, night sweats, weight loss, chest pain, or shortness of breath.   Objective:    General: Well Developed, well nourished, and in no acute distress.  Neuro: Alert and oriented x3, extra-ocular muscles intact, sensation grossly intact.  HEENT: Normocephalic, atraumatic, pupils equal round reactive to light, neck supple, no masses, no lymphadenopathy, thyroid nonpalpable.  Skin: Warm and dry, no rashes. Cardiac: Regular rate and rhythm, no murmurs rubs or gallops, no lower extremity edema.  Respiratory: Clear to auscultation bilaterally. Not using accessory muscles, speaking in full sentences.  Hemoglobin A1c is 5.6.  Impression and Recommendations:

## 2014-11-05 ENCOUNTER — Ambulatory Visit (INDEPENDENT_AMBULATORY_CARE_PROVIDER_SITE_OTHER): Payer: Medicare Other | Admitting: Family Medicine

## 2014-11-05 VITALS — Temp 97.8°F

## 2014-11-05 DIAGNOSIS — Z23 Encounter for immunization: Secondary | ICD-10-CM

## 2014-11-05 NOTE — Progress Notes (Signed)
Received flu shot

## 2014-12-04 ENCOUNTER — Encounter: Payer: Self-pay | Admitting: Sports Medicine

## 2014-12-04 ENCOUNTER — Ambulatory Visit (INDEPENDENT_AMBULATORY_CARE_PROVIDER_SITE_OTHER): Payer: Medicare Other | Admitting: Sports Medicine

## 2014-12-04 DIAGNOSIS — K449 Diaphragmatic hernia without obstruction or gangrene: Secondary | ICD-10-CM | POA: Diagnosis not present

## 2014-12-04 MED ORDER — OMEPRAZOLE 40 MG PO CPDR: DELAYED_RELEASE_CAPSULE | ORAL | Status: AC

## 2014-12-04 NOTE — Assessment & Plan Note (Signed)
No nausea, vomiting, melena, or hematochezia, predominantly acid reflux type symptoms. Fantastic response to Nexium. This is expensive so we will do generic omeprazole 40 mg. No symptoms of a cardiac etiology.

## 2014-12-04 NOTE — Progress Notes (Signed)
  Subjective:    CC: Acid reflux  HPI: This is a pleasant 64 year old male with a known history of hiatal hernia with dyspepsia.  Initially symptoms were well controlled with Nexium, unfortunately he has self discontinued Nexium is having a recurrence. He has seen a gastroenterologist and has an upper endoscopy in the recent past. Symptoms are moderate, persistent, no hematemesis, melena, or hematochezia.  Past medical history, Surgical history, Family history not pertinant except as noted below, Social history, Allergies, and medications have been entered into the medical record, reviewed, and no changes needed.   Review of Systems: No fevers, chills, night sweats, weight loss, chest pain, or shortness of breath.   Objective:    General: Well Developed, well nourished, and in no acute distress.  Neuro: Alert and oriented x3, extra-ocular muscles intact, sensation grossly intact.  HEENT: Normocephalic, atraumatic, pupils equal round reactive to light, neck supple, no masses, no lymphadenopathy, thyroid nonpalpable.  Skin: Warm and dry, no rashes. Cardiac: Regular rate and rhythm, no murmurs rubs or gallops, no lower extremity edema.  Respiratory: Clear to auscultation bilaterally. Not using accessory muscles, speaking in full sentences. Abdomen: Soft, nontender, nondistended, normal bowel sounds, no palpable masses.  Impression and Recommendations:    I spent 25 minutes with this patient, greater than 50% was face-to-face time counseling regarding the above diagnoses

## 2014-12-04 NOTE — Patient Instructions (Signed)
Hiatal Hernia  A hiatal hernia occurs when part of your stomach slides above the muscle that separates your abdomen from your chest (diaphragm). You can be born with a hiatal hernia (congenital), or it may develop over time. In almost all cases of hiatal hernia, only the top part of the stomach pushes through.   Many people have a hiatal hernia with no symptoms. The larger the hernia, the more likely that you will have symptoms. In some cases, a hiatal hernia allows stomach acid to flow back into the tube that carries food from your mouth to your stomach (esophagus). This may cause heartburn symptoms. Severe heartburn symptoms may mean you have developed a condition called gastroesophageal reflux disease (GERD).   CAUSES   Hiatal hernias are caused by a weakness in the opening (hiatus) where your esophagus passes through your diaphragm to attach to the upper part of your stomach. You may be born with a weakness in your hiatus, or a weakness can develop.  RISK FACTORS  Older age is a major risk factor for a hiatal hernia. Anything that increases pressure on your diaphragm can also increase your risk of a hiatal hernia. This includes:  · Pregnancy.  · Excess weight.  · Frequent constipation.  SIGNS AND SYMPTOMS   People with a hiatal hernia often have no symptoms. If symptoms develop, they are almost always caused by GERD. They may include:  · Heartburn.  · Belching.  · Indigestion.  · Trouble swallowing.  · Coughing or wheezing.   · Sore throat.  · Hoarseness.  · Chest pain.  DIAGNOSIS   A hiatal hernia is sometimes found during an exam for another problem. Your health care provider may suspect a hiatal hernia if you have symptoms of GERD. Tests may be done to diagnose GERD. These may include:  · X-rays of your stomach or chest.  · An upper gastrointestinal (GI) series. This is an X-ray exam of your GI tract involving the use of a chalky liquid that you swallow. The liquid shows up clearly on the X-ray.  · Endoscopy.  This is a procedure to look into your stomach using a thin, flexible tube that has a tiny camera and light on the end of it.  TREATMENT   If you have no symptoms, you may not need treatment. If you have symptoms, treatment may include:  · Dietary and lifestyle changes to help reduce GERD symptoms.  · Medicines. These may include:    Over-the-counter antacids.    Medicines that make your stomach empty more quickly.    Medicines that block the production of stomach acid (H2 blockers).    Stronger medicines to reduce stomach acid (proton pump inhibitors).  · You may need surgery to repair the hernia if other treatments are not helping.  HOME CARE INSTRUCTIONS   · Take all medicines as directed by your health care provider.  · Quit smoking, if you smoke.  · Try to achieve and maintain a healthy body weight.  · Eat frequent small meals instead of three large meals a day. This keeps your stomach from getting too full.    Eat slowly.    Do not lie down right after eating.     Do not eat 1-2 hours before bed.    · Do not drink beverages with caffeine. These include cola, coffee, cocoa, and tea.  · Do not drink alcohol.  · Avoid foods that can make symptoms of GERD worse. These may include:    Fatty foods.      Citrus fruits.    Other foods and drinks that contain acid.  · Avoid putting pressure on your belly. Anything that puts pressure on your belly increases the amount of acid that may be pushed up into your esophagus.      Avoid bending over, especially after eating.    Raise the head of your bed by putting blocks under the legs. This keeps your head and esophagus higher than your stomach.    Do not wear tight clothing around your chest or stomach.    Try not to strain when having a bowel movement, when urinating, or when lifting heavy objects.  SEEK MEDICAL CARE IF:  · Your symptoms are not controlled with medicines or lifestyle changes.  · You are having trouble swallowing.  · You have coughing or wheezing that will not  go away.  SEEK IMMEDIATE MEDICAL CARE IF:  · Your pain is getting worse.  · Your pain spreads to your arms, neck, jaw, teeth, or back.  · You have shortness of breath.  · You sweat for no reason.  · You feel sick to your stomach (nauseous) or vomit.  · You vomit blood.  · You have bright red blood in your stools.  · You have black, tarry stools.       This information is not intended to replace advice given to you by your health care provider. Make sure you discuss any questions you have with your health care provider.     Document Released: 03/25/2003 Document Revised: 01/23/2014 Document Reviewed: 12/20/2012  Elsevier Interactive Patient Education ©2016 [employer]

## 2015-01-04 ENCOUNTER — Ambulatory Visit: Payer: BLUE CROSS/BLUE SHIELD | Admitting: Sports Medicine

## 2015-01-05 ENCOUNTER — Ambulatory Visit (INDEPENDENT_AMBULATORY_CARE_PROVIDER_SITE_OTHER): Payer: Medicare Other | Admitting: Sports Medicine

## 2015-01-05 ENCOUNTER — Encounter: Payer: Self-pay | Admitting: Sports Medicine

## 2015-01-05 VITALS — BP 142/69 | HR 56 | Temp 98.0°F | Resp 18 | Wt 147.0 lb

## 2015-01-05 DIAGNOSIS — R918 Other nonspecific abnormal finding of lung field: Secondary | ICD-10-CM

## 2015-01-05 NOTE — Assessment & Plan Note (Signed)
There are several pulmonary nodules that appear increased from the previous CT scan, recent CT scan was done this week. At this point we are going to proceed with PET/CT for further evaluation before consideration of referral to pulmonology.

## 2015-01-05 NOTE — Progress Notes (Signed)
  Subjective:    CC: follow-up  HPI: Omar Marsh had some shortness of breath several days ago, [facility] was seen [facility] where x-rays showed what appeared to be a pulmonary mass. A stat CT scan showed enlarging pulmonary nodules.  these nodules have been present on prior CT scans, however they did appear somewhat larger. Shortness of breath has improved after a single dose of levofloxacin.  Past medical history, Surgical history, Family history not pertinant except as noted below, Social history, Allergies, and medications have been entered into the medical record, reviewed, and no changes needed.   Review of Systems: No fevers, chills, night sweats, weight loss, chest pain, or shortness of breath.   Objective:    General: Well Developed, well nourished, and in no acute distress.  Neuro: Alert and oriented x3, extra-ocular muscles intact, sensation grossly intact.  HEENT: Normocephalic, atraumatic, pupils equal round reactive to light, neck supple, no masses, no lymphadenopathy, thyroid nonpalpable.  Skin: Warm and dry, no rashes. Cardiac: Regular rate and rhythm, no murmurs rubs or gallops, no lower extremity edema.  Respiratory: Clear to auscultation bilaterally. Not using accessory muscles, speaking in full sentences.  Impression and Recommendations:    I spent 25 minutes with this patient, greater than 50% was face-to-face time counseling regarding the above diagnoses

## 2015-01-14 ENCOUNTER — Encounter: Payer: Self-pay | Admitting: Sports Medicine

## 2015-01-21 ENCOUNTER — Telehealth: Payer: Self-pay

## 2015-01-21 DIAGNOSIS — R918 Other nonspecific abnormal finding of lung field: Secondary | ICD-10-CM

## 2015-01-21 NOTE — Telephone Encounter (Signed)
Patient called and left a message asking about the results to his scan. Has Dr Dianah Field received the results for the body scan?

## 2015-01-25 NOTE — Telephone Encounter (Signed)
Nothing seen in chart. Dr . Darene Lamer. please advise

## 2015-01-25 NOTE — Telephone Encounter (Signed)
Did he have the scan yet? If so where?  Also okay to contact  imaging to see why results have not populated into Epic.

## 2015-01-26 NOTE — Telephone Encounter (Signed)
Patient advised or results and recommendations.

## 2015-01-26 NOTE — Telephone Encounter (Signed)
PET scan showed a small pneumothorax that is unlikely clinically significant, there is a new nodular density with low level activity in the right lung base, likely a subclinical infection, we will follow this up with a CT in 6 months. He has already done a course of levofloxacin.

## 2015-01-26 NOTE — Telephone Encounter (Signed)
Sent medical records to [facility] for imaging report. Fax # [phone].

## 2015-02-08 ENCOUNTER — Ambulatory Visit (INDEPENDENT_AMBULATORY_CARE_PROVIDER_SITE_OTHER): Payer: Medicare Other | Admitting: Family Medicine

## 2015-02-08 ENCOUNTER — Encounter: Payer: Self-pay | Admitting: Family Medicine

## 2015-02-08 VITALS — BP 124/54 | HR 75 | Temp 97.7°F | Wt 146.0 lb

## 2015-02-08 DIAGNOSIS — R1032 Left lower quadrant pain: Secondary | ICD-10-CM | POA: Diagnosis not present

## 2015-02-08 MED ORDER — METRONIDAZOLE 500 MG PO TABS: 500.0000 mg | ORAL_TABLET | Freq: Three times a day (TID) | ORAL | Status: AC

## 2015-02-08 MED ORDER — CIPROFLOXACIN HCL 500 MG PO TABS: 500.0000 mg | ORAL_TABLET | Freq: Two times a day (BID) | ORAL | Status: DC

## 2015-02-08 NOTE — Progress Notes (Signed)
       Omar Marsh is a 65 y.o. male who presents to [facility]: [facility] today for abdominal pain. Patient has a one-day history of left lower quadrant abdominal pain associated with blood in the stool. He denies any fevers chills or vomiting. He denies any chest pain palpitations or shortness of breath currently. He has not tried any medications for his symptoms. He notes food tends to make his pain worse. He denies any history of similar symptoms. Symptoms are mild to moderate currently.   No past medical history on file. No past surgical history on file. Social History  Substance Use Topics  . Smoking status: Unknown If Ever Smoked  . Smokeless tobacco: Not on file  . Alcohol Use: Not on file   family history is not on file.  ROS as above Medications: Current Outpatient Prescriptions  Medication Sig Dispense Refill  . etanercept (ENBREL) 50 MG/ML injection Inject 50 mg into the skin once a week.    . finasteride (PROSCAR) 5 MG tablet Take 5 mg by mouth daily.    . hydroxychloroquine (PLAQUENIL) 200 MG tablet     . omeprazole (PRILOSEC) 40 MG capsule 1 capsule twice daily for a week and then once daily at dinnertime 60 capsule 11  . tamsulosin (FLOMAX) 0.4 MG CAPS capsule Take by mouth.    . ciprofloxacin (CIPRO) 500 MG tablet Take 1 tablet (500 mg total) by mouth 2 (two) times daily. 14 tablet 0  . metroNIDAZOLE (FLAGYL) 500 MG tablet Take 1 tablet (500 mg total) by mouth 3 (three) times daily. 21 tablet 0   No current facility-administered medications for this visit.   No Known Allergies   Exam:  BP 124/54 mmHg  Pulse 75  Temp(Src) 97.7 F (36.5 C) (Oral)  Wt 146 lb (66.225 kg)  SpO2 100% Gen: Well NAD nontoxic appearing HEENT: EOMI,  MMM Lungs: Normal work of breathing. CTABL Heart: RRR no MRG Abd: NABS, Soft. Nondistended, minimally tender left lower quadrant. no rebound  or guarding. No masses palpated Exts: Brisk capillary refill, warm and well perfused.   Review of medical records reveals colonoscopy in 2013 significant for diverticula present  No results found for this or any previous visit (from the past 24 hour(s)). No results found.   Please see individual assessment and plan sections.

## 2015-02-08 NOTE — Assessment & Plan Note (Signed)
Patient is well-appearing and can eat and drink. He is afebrile and he does not have peritoneal abdominal signs. 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 I think the most likely explanation for his current symptoms is diverticulitis. It appears to be mild. Plan to treat empirically with Cipro and Flagyl. Check CBC, CMP, and lipase. Follow-up in 2-3 days. Red flag precautions reviewed in detail with patient. Handout provided.

## 2015-02-08 NOTE — Patient Instructions (Signed)
Thank you for coming in today. Take cipro and flagyl antibiotics.  Eat a clear liquid diet.  Follow up with myself of Dr T later this week (Thursday).  Return or go to the ER if you get worse.  If your belly pain worsens, or you have high fever, bad vomiting, blood in your stool or black tarry stool go to the Emergency Room.   Get labs today.   Diverticulitis Diverticulitis is inflammation or infection of small pouches in your colon that form when you have a condition called diverticulosis. The pouches in your colon are called diverticula. Your colon, or large intestine, is where water is absorbed and stool is formed. Complications of diverticulitis can include:  Bleeding.  Severe infection.  Severe pain.  Perforation of your colon.  Obstruction of your colon. CAUSES  Diverticulitis is caused by bacteria. Diverticulitis happens when stool becomes trapped in diverticula. This allows bacteria to grow in the diverticula, which can lead to inflammation and infection. RISK FACTORS People with diverticulosis are at risk for diverticulitis. Eating a diet that does not include enough fiber from fruits and vegetables may make diverticulitis more likely to develop. SYMPTOMS  Symptoms of diverticulitis may include:  Abdominal pain and tenderness. The pain is normally located on the left side of the abdomen, but may occur in other areas.  Fever and chills.  Bloating.  Cramping.  Nausea.  Vomiting.  Constipation.  Diarrhea.  Blood in your stool. DIAGNOSIS  Your health care provider will ask you about your medical history and do a physical exam. You may need to have tests done because many medical conditions can cause the same symptoms as diverticulitis. Tests may include:  Blood tests.  Urine tests.  Imaging tests of the abdomen, including X-rays and CT scans. When your condition is under control, your health care provider may recommend that you have a colonoscopy. A  colonoscopy can show how severe your diverticula are and whether something else is causing your symptoms. TREATMENT  Most cases of diverticulitis are mild and can be treated at home. Treatment may include:  Taking over-the-counter pain medicines.  Following a clear liquid diet.  Taking antibiotic medicines by mouth for 7-10 days. More severe cases may be treated at a hospital. Treatment may include:  Not eating or drinking.  Taking prescription pain medicine.  Receiving antibiotic medicines through an IV tube.  Receiving fluids and nutrition through an IV tube.  Surgery. HOME CARE INSTRUCTIONS   Follow your health care provider's instructions carefully.  Follow a full liquid diet or other diet as directed by your health care provider. After your symptoms improve, your health care provider may tell you to change your diet. He or she may recommend you eat a high-fiber diet. Fruits and vegetables are good sources of fiber. Fiber makes it easier to pass stool.  Take fiber supplements or probiotics as directed by your health care provider.  Only take medicines as directed by your health care provider.  Keep all your follow-up appointments. SEEK MEDICAL CARE IF:   Your pain does not improve.  You have a hard time eating food.  Your bowel movements do not return to normal. SEEK IMMEDIATE MEDICAL CARE IF:   Your pain becomes worse.  Your symptoms do not get better.  Your symptoms suddenly get worse.  You have a fever.  You have repeated vomiting.  You have bloody or black, tarry stools. MAKE SURE YOU:   Understand these instructions.  Will watch your condition.  Will get help right away if you are not doing well or get worse.   This information is not intended to replace advice given to you by your health care provider. Make sure you discuss any questions you have with your health care provider.   Document Released: 10/12/2004 Document Revised: 01/07/2013 Document  Reviewed: 11/27/2012 Elsevier Interactive Patient Education [employer].

## 2015-02-09 LAB — COMPREHENSIVE METABOLIC PANEL
ALBUMIN: 4 g/dL (ref 3.6–5.1)
ALT: 11 U/L (ref 9–46)
AST: 16 U/L (ref 10–35)
Alkaline Phosphatase: 49 U/L (ref 40–115)
BILIRUBIN TOTAL: 0.7 mg/dL (ref 0.2–1.2)
BUN: 18 mg/dL (ref 7–25)
CALCIUM: 9 mg/dL (ref 8.6–10.3)
CO2: 31 mmol/L (ref 20–31)
CREATININE: 0.79 mg/dL (ref 0.70–1.25)
Chloride: 101 mmol/L (ref 98–110)
Glucose, Bld: 150 mg/dL — ABNORMAL HIGH (ref 65–99)
Potassium: 4.3 mmol/L (ref 3.5–5.3)
SODIUM: 139 mmol/L (ref 135–146)
TOTAL PROTEIN: 7.2 g/dL (ref 6.1–8.1)

## 2015-02-09 LAB — CBC
HCT: 41.7 % (ref 39.0–52.0)
HEMOGLOBIN: 14.3 g/dL (ref 13.0–17.0)
MCH: 30.6 pg (ref 26.0–34.0)
MCHC: 34.3 g/dL (ref 30.0–36.0)
MCV: 89.1 fL (ref 78.0–100.0)
MPV: 10.2 fL (ref 8.6–12.4)
Platelets: 195 10*3/uL (ref 150–400)
RBC: 4.68 MIL/uL (ref 4.22–5.81)
RDW: 13.6 % (ref 11.5–15.5)
WBC: 11.2 10*3/uL — AB (ref 4.0–10.5)

## 2015-02-09 LAB — LIPASE: Lipase: 6 U/L — ABNORMAL LOW (ref 7–60)

## 2015-02-09 NOTE — Progress Notes (Signed)
Quick Note:  Labs look ok. Follow up later this week. ______

## 2015-02-11 ENCOUNTER — Encounter: Payer: Self-pay | Admitting: Sports Medicine

## 2015-02-11 ENCOUNTER — Ambulatory Visit (INDEPENDENT_AMBULATORY_CARE_PROVIDER_SITE_OTHER): Payer: Medicare Other | Admitting: Sports Medicine

## 2015-02-11 VITALS — BP 136/77 | HR 55 | Temp 97.8°F | Resp 16 | Wt 145.3 lb

## 2015-02-11 DIAGNOSIS — R1032 Left lower quadrant pain: Secondary | ICD-10-CM

## 2015-02-11 NOTE — Assessment & Plan Note (Signed)
Current clinical diagnosis of diverticulitis, started antibiotics just 3 days ago. Still having some pain as expected as well as bleeding, hemoglobin has dropped 1.2 points, for this reason I'm going to see him back in a week. Continue antibiotics, stool softeners, increase fiber , recommended low residue diet for now.  Return in one week.

## 2015-02-11 NOTE — Progress Notes (Signed)
  Subjective:    CC:  Follow-up  HPI: This is a pleasant 65 year old male, he was treated for diverticulitis 3 days ago appropriately with Cipro and Flagyl, CT scan showed no evidence of perforation. Hemoglobin at the time was 14.1. Improved slightly but still has a significant amount of pain, and still has some bleeding, no presyncope , chest pain, shortness of breath. No constitutional symptoms. Currently he is doing MiraLAX and is agreeable to increase his fiber.  Past medical history, Surgical history, Family history not pertinant except as noted below, Social history, Allergies, and medications have been entered into the medical record, reviewed, and no changes needed.   Review of Systems: No fevers, chills, night sweats, weight loss, chest pain, or shortness of breath.   Objective:    General: Well Developed, well nourished, and in no acute distress.  Neuro: Alert and oriented x3, extra-ocular muscles intact, sensation grossly intact.  HEENT: Normocephalic, atraumatic, pupils equal round reactive to light, neck supple, no masses, no lymphadenopathy, thyroid nonpalpable.  Skin: Warm and dry, no rashes. Cardiac: Regular rate and rhythm, no murmurs rubs or gallops, no lower extremity edema.  Respiratory: Clear to auscultation bilaterally. Not using accessory muscles, speaking in full sentences. Abdomen: Minimally tender in the left lower quadrant, nondistended, no guarding, rigidity, no palpable masses.  Impression and Recommendations:

## 2015-02-11 NOTE — Patient Instructions (Signed)
Diverticulitis °Diverticulitis is inflammation or infection of small pouches in your colon that form when you have a condition called diverticulosis. The pouches in your colon are called diverticula. Your colon, or large intestine, is where water is absorbed and stool is formed. °Complications of diverticulitis can include: °· Bleeding. °· Severe infection. °· Severe pain. °· Perforation of your colon. °· Obstruction of your colon. °CAUSES  °Diverticulitis is caused by bacteria. °Diverticulitis happens when stool becomes trapped in diverticula. This allows bacteria to grow in the diverticula, which can lead to inflammation and infection. °RISK FACTORS °People with diverticulosis are at risk for diverticulitis. Eating a diet that does not include enough fiber from fruits and vegetables may make diverticulitis more likely to develop. °SYMPTOMS  °Symptoms of diverticulitis may include: °· Abdominal pain and tenderness. The pain is normally located on the left side of the abdomen, but may occur in other areas. °· Fever and chills. °· Bloating. °· Cramping. °· Nausea. °· Vomiting. °· Constipation. °· Diarrhea. °· Blood in your stool. °DIAGNOSIS  °Your health care provider will ask you about your medical history and do a physical exam. You may need to have tests done because many medical conditions can cause the same symptoms as diverticulitis. Tests may include: °· Blood tests. °· Urine tests. °· Imaging tests of the abdomen, including X-rays and CT scans. °When your condition is under control, your health care provider may recommend that you have a colonoscopy. A colonoscopy can show how severe your diverticula are and whether something else is causing your symptoms. °TREATMENT  °Most cases of diverticulitis are mild and can be treated at home. Treatment may include: °· Taking over-the-counter pain medicines. °· Following a clear liquid diet. °· Taking antibiotic medicines by mouth for 7-10 days. °More severe cases may  be treated at a hospital. Treatment may include: °· Not eating or drinking. °· Taking prescription pain medicine. °· Receiving antibiotic medicines through an IV tube. °· Receiving fluids and nutrition through an IV tube. °· Surgery. °HOME CARE INSTRUCTIONS  °· Follow your health care provider's instructions carefully. °· Follow a full liquid diet or other diet as directed by your health care provider. After your symptoms improve, your health care provider may tell you to change your diet. He or she may recommend you eat a high-fiber diet. Fruits and vegetables are good sources of fiber. Fiber makes it easier to pass stool. °· Take fiber supplements or probiotics as directed by your health care provider. °· Only take medicines as directed by your health care provider. °· Keep all your follow-up appointments. °SEEK MEDICAL CARE IF:  °· Your pain does not improve. °· You have a hard time eating food. °· Your bowel movements do not return to normal. °SEEK IMMEDIATE MEDICAL CARE IF:  °· Your pain becomes worse. °· Your symptoms do not get better. °· Your symptoms suddenly get worse. °· You have a fever. °· You have repeated vomiting. °· You have bloody or black, tarry stools. °MAKE SURE YOU:  °· Understand these instructions. °· Will watch your condition. °· Will get help right away if you are not doing well or get worse. °  °This information is not intended to replace advice given to you by your health care provider. Make sure you discuss any questions you have with your health care provider. °  °Document Released: 10/12/2004 Document Revised: 01/07/2013 Document Reviewed: 11/27/2012 °Elsevier Interactive Patient Education ©2016 Else[employer] Inc[employer] ° °

## 2015-02-17 ENCOUNTER — Ambulatory Visit: Payer: BLUE CROSS/BLUE SHIELD | Admitting: Sports Medicine

## 2015-02-18 ENCOUNTER — Ambulatory Visit (INDEPENDENT_AMBULATORY_CARE_PROVIDER_SITE_OTHER): Payer: Medicare Other | Admitting: Sports Medicine

## 2015-02-18 ENCOUNTER — Encounter: Payer: Self-pay | Admitting: Sports Medicine

## 2015-02-18 VITALS — BP 131/76 | HR 59 | Temp 97.8°F | Resp 16 | Wt 139.5 lb

## 2015-02-18 DIAGNOSIS — R1032 Left lower quadrant pain: Secondary | ICD-10-CM

## 2015-02-18 LAB — POCT HEMOGLOBIN
Hemoglobin: 13.1 g/dL — AB (ref 14.1–18.1)
Hemoglobin: 11.2 g/dL — AB (ref 14.1–18.1)

## 2015-02-18 NOTE — Assessment & Plan Note (Addendum)
Clinically represented diverticulitis now resolved. He had a bit of a hemoglobin drop at the last visit, rechecking point care hemoglobin today. Return as needed.  Point of care hemoglobin has dropped a bit more, clinically he is well, so we will avoid any aggressive intervention, I will simply recheck a CBC in one month.

## 2015-02-18 NOTE — Progress Notes (Addendum)
  Subjective:    CC: Follow-up  HPI: Diverticulitis: Has now been through Cipro and Flagyl, all bleeding and abdominal pain has resolved and he is ready to advance his diet.  Past medical history, Surgical history, Family history not pertinant except as noted below, Social history, Allergies, and medications have been entered into the medical record, reviewed, and no changes needed.   Review of Systems: No fevers, chills, night sweats, weight loss, chest pain, or shortness of breath.   Objective:    General: Well Developed, well nourished, and in no acute distress.  Neuro: Alert and oriented x3, extra-ocular muscles intact, sensation grossly intact.  HEENT: Normocephalic, atraumatic, pupils equal round reactive to light, neck supple, no masses, no lymphadenopathy, thyroid nonpalpable.  Skin: Warm and dry, no rashes. Cardiac: Regular rate and rhythm, no murmurs rubs or gallops, no lower extremity edema.  Respiratory: Clear to auscultation bilaterally. Not using accessory muscles, speaking in full sentences. Abdomen: Soft, nontender, nondistended, no palpable masses, no guarding, no rigidity.  Point of care hemoglobin is 11.2  Impression and Recommendations:

## 2015-02-18 NOTE — Addendum Note (Signed)
Addended by: Silverio Decamp on: 02/18/2015 09:13 AM   Modules accepted: Orders

## 2015-03-09 ENCOUNTER — Ambulatory Visit (INDEPENDENT_AMBULATORY_CARE_PROVIDER_SITE_OTHER): Payer: Medicare Other | Admitting: Sports Medicine

## 2015-03-09 DIAGNOSIS — R1032 Left lower quadrant pain: Secondary | ICD-10-CM | POA: Diagnosis not present

## 2015-03-09 LAB — POCT HEMOGLOBIN: Hemoglobin: 12.4 g/dL — AB (ref 14.1–18.1)

## 2015-03-09 NOTE — Progress Notes (Signed)
  Subjective:    CC: Abdominal pain  HPI: Several weeks ago we treated this pleasant 65 year old male for diverticulitis, symptoms resolved with antibiotics, he did drop his hemoglobin to 11, he did well until recently when he started to develop increasing left upper quadrant and epigastric pain, made worse with food, associated with significant eructations and gas. Denies any melena or hematochezia, or hematemesis. No constitutional symptoms.  Past medical history, Surgical history, Family history not pertinant except as noted below, Social history, Allergies, and medications have been entered into the medical record, reviewed, and no changes needed.   Review of Systems: No fevers, chills, night sweats, weight loss, chest pain, or shortness of breath.   Objective:    General: Well Developed, well nourished, and in no acute distress.  Neuro: Alert and oriented x3, extra-ocular muscles intact, sensation grossly intact.  HEENT: Normocephalic, atraumatic, pupils equal round reactive to light, neck supple, no masses, no lymphadenopathy, thyroid nonpalpable.  Skin: Warm and dry, no rashes. Cardiac: Regular rate and rhythm, no murmurs rubs or gallops, no lower extremity edema.  Respiratory: Clear to auscultation bilaterally. Not using accessory muscles, speaking in full sentences. Abdomen: Soft with minimal guarding in the epigastrium and left upper quadrant, normal bowel sounds, no palpable masses.  Impression and Recommendations:

## 2015-03-09 NOTE — Assessment & Plan Note (Signed)
Good response to aggressive antibiotic treatment for diverticulitis with resolution of symptoms, hemoglobin did drop to 11, but has come up to 12. Unfortunately now having recurrence of abdominal pain, epigastric, left upper quadrant. He is currently taking protonic twice a day. At this point I do think we need the assistance of gastroenterology likely for upper endoscopy. Referral to Dr. Clydene Laming.

## 2015-04-07 ENCOUNTER — Encounter: Payer: Self-pay | Admitting: Sports Medicine

## 2015-08-15 IMAGING — CT CT ABD-PELV W/O CM
2 of 4 series · 14 of 32 positions shown, 19 images · non-contrast
Comparison: 06/13/2007

CLINICAL DATA: Bilateral flank pain.  Hematuria.  Renal calculi.

EXAM:
CT ABDOMEN AND PELVIS WITHOUT CONTRAST
TECHNIQUE: Multidetector CT imaging of the abdomen and pelvis was performed
following the standard protocol without IV contrast.

[Series 2: abd/pelvis without · axial · non-contrast · 0.72mm/px · z∈[-432,-82]mm · 8 of 91 slices shown, 13 images]
[im 11/91  soft-tissue]
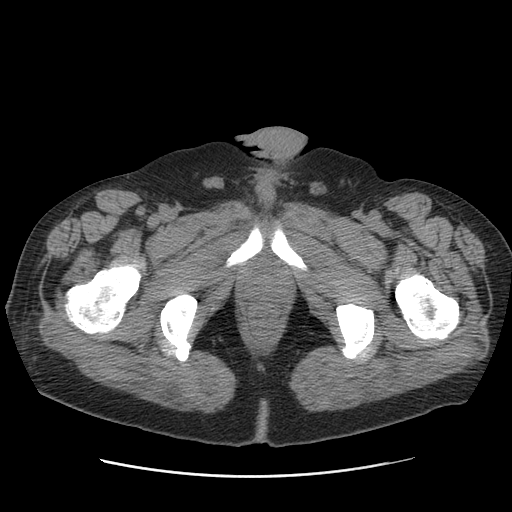
[im 11/91  bone]
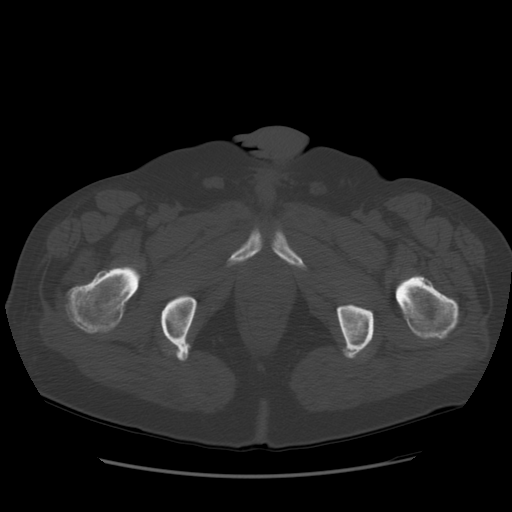
[im 21/91  soft-tissue]
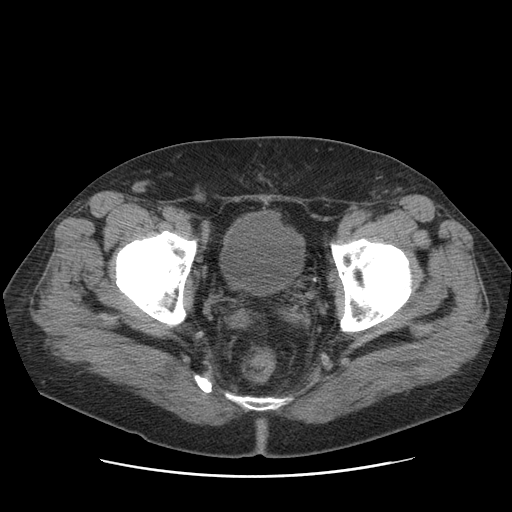
[im 31/91  soft-tissue]
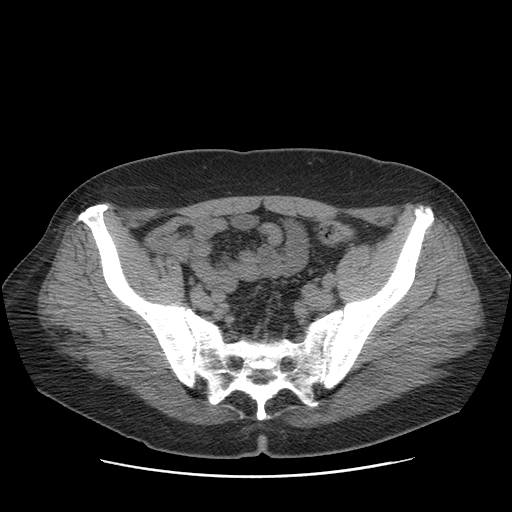
[im 41/91  soft-tissue]
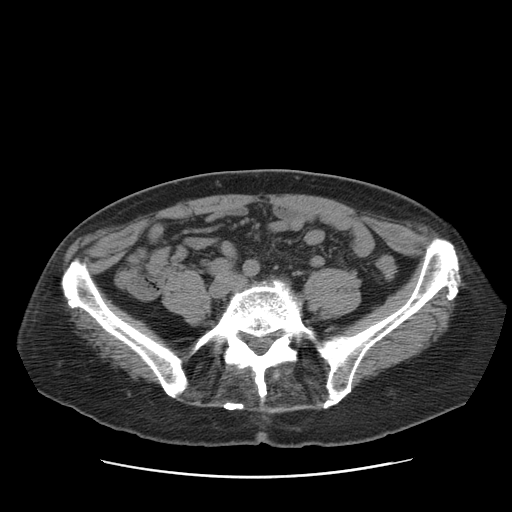
[im 51/91  soft-tissue]
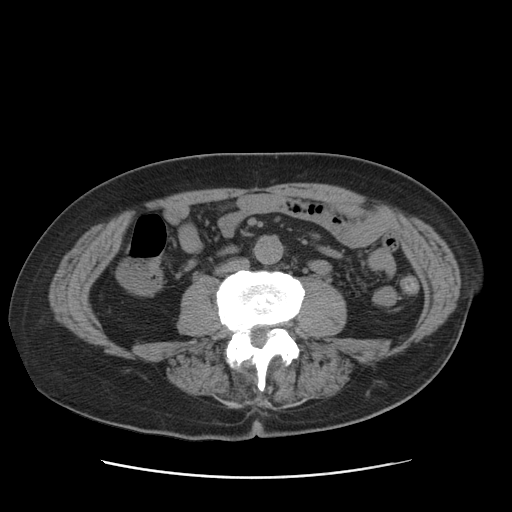
[im 51/91  lung]
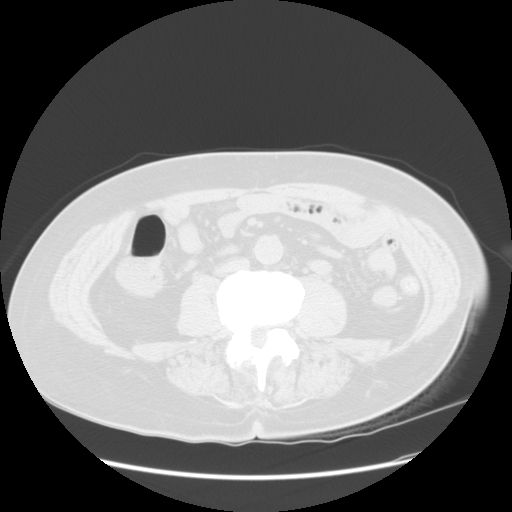
[im 61/91  soft-tissue]
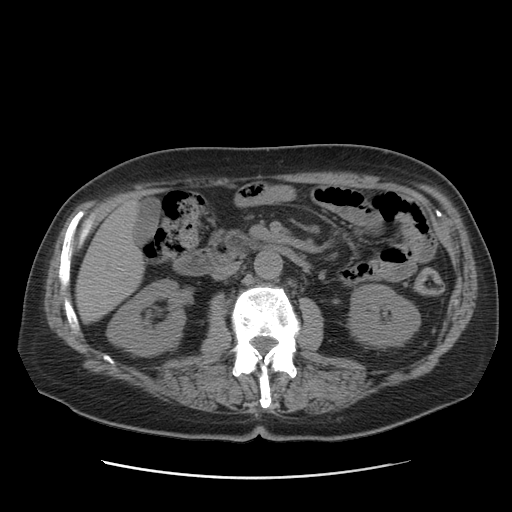
[im 61/91  lung]
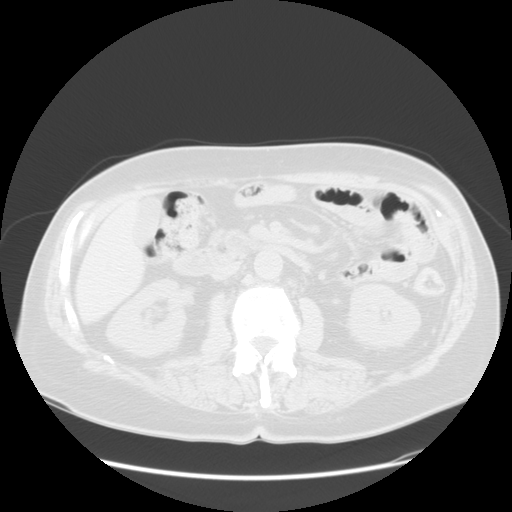
[im 71/91  soft-tissue]
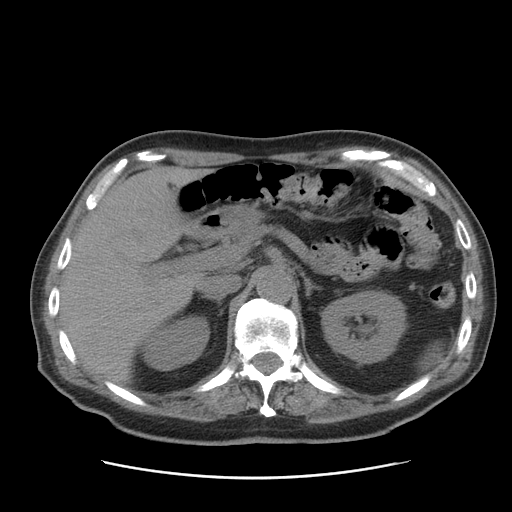
[im 71/91  lung]
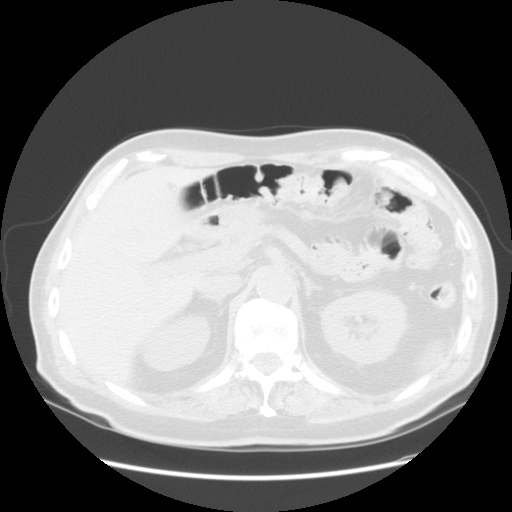
[im 81/91  soft-tissue]
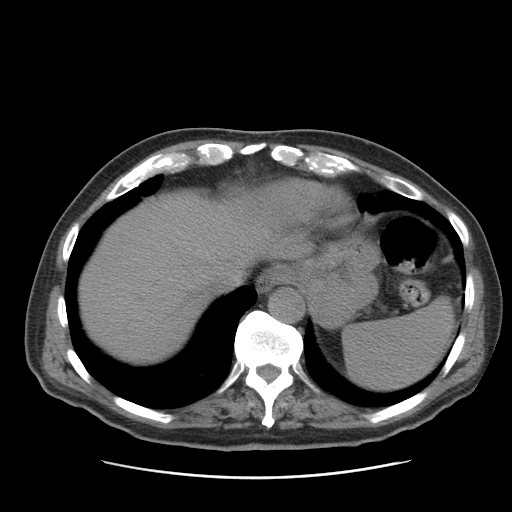
[im 81/91  lung]
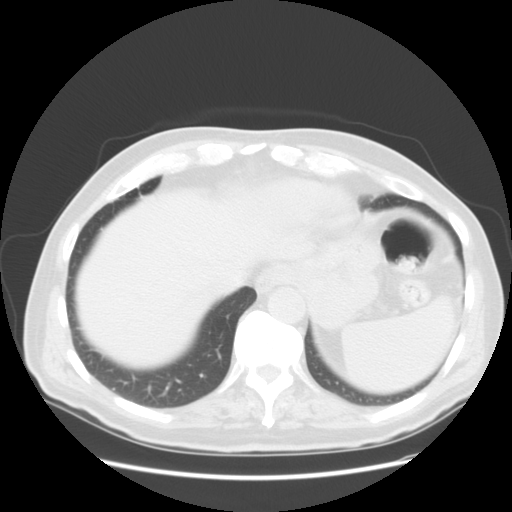

[Series 400: sag · sagittal · 0.91mm/px · 6 of 114 slices shown]
[im 11/114  soft-tissue]
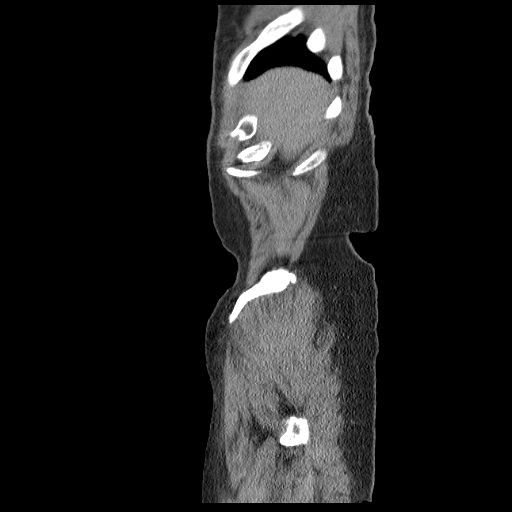
[im 21/114  soft-tissue]
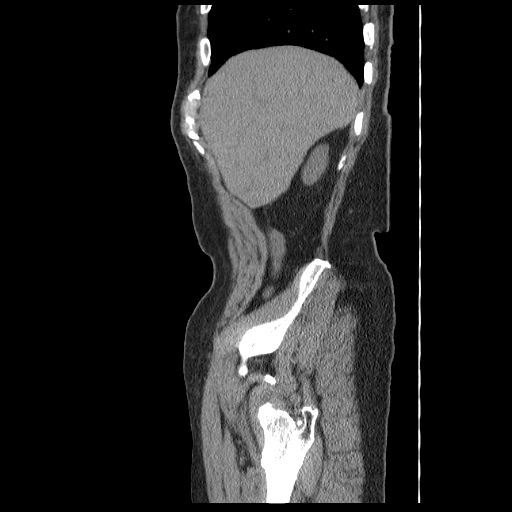
[im 42/114  soft-tissue]
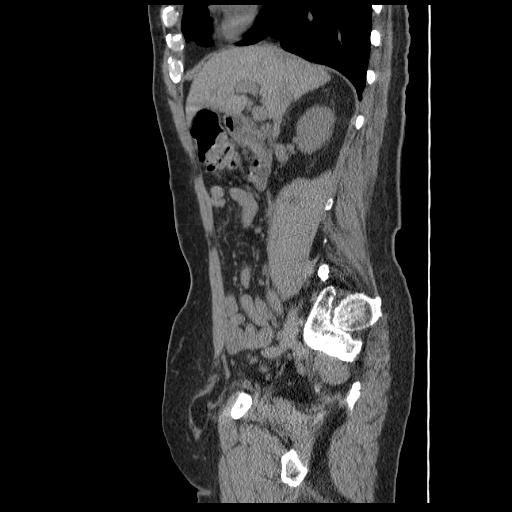
[im 52/114  soft-tissue]
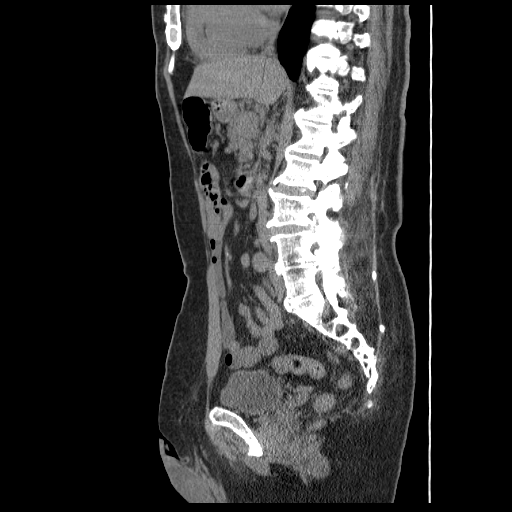
[im 62/114  soft-tissue]
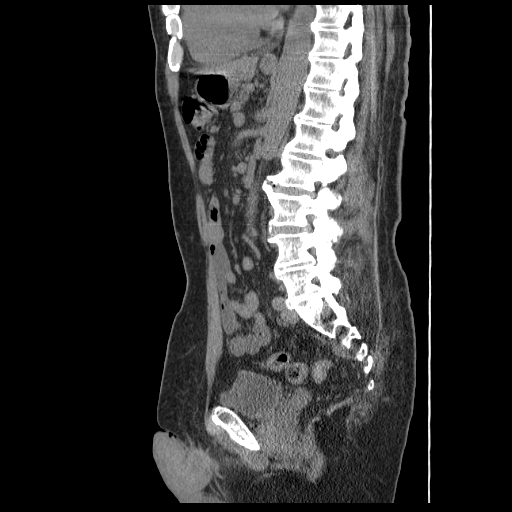
[im 72/114  soft-tissue]
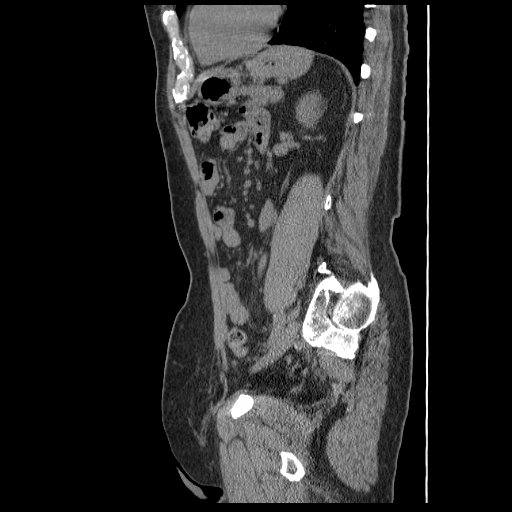

[14 of 32 positions shown; findings below may reference images not displayed]

FINDINGS: Right base nodularity similar to 3009 exam. Linear opacities in the
lingula and right middle lobe compatible with scarring or
atelectasis.

The noncontrast CT appearance of the liver, spleen, pancreas, and
adrenal glands is within normal limits. No specific gallbladder or
biliary abnormality identified. Kidneys and proximal ureters
unremarkable. No ureteral calculus observed.

No pathologic upper abdominal adenopathy is observed. No pathologic
pelvic adenopathy is observed. Appendix unremarkable. Sacrotuberous
ligament ossification noted on the right side. There is bridging
spurring of the left sacroiliac joint and ossification along the
iliolumbar ligaments.

There is spurring of both acetabula. Considerable lumbar spondylosis
noted with fusion across multiple facet joints and ossification at
the interspinous ligaments.

No free pelvic fluid.  Urinary bladder appears grossly unremarkable.

Small bilateral inguinal hernias contain adipose tissue.
Postoperative findings are noted in the lumbar spine.
IMPRESSION: 1. A cause for hematuria is not identified on today's noncontrast
CT.
2. Stable nodularity at the right lung base, considered benign given
the stability. Mild scarring or subsegmental atelectasis in the
right middle lobe and lingula.
3. Ossification along multiple ligamentous structures including the
ilial lumbar ligaments and right sacrotuberous ligament
4. Lumbar spondylosis.
5. Small bilateral inguinal hernias contain adipose tissue.

## 2015-09-27 ENCOUNTER — Telehealth: Payer: Self-pay

## 2015-09-27 NOTE — Telephone Encounter (Signed)
Pt states he's trying to get secondary insurance but they will not allow it due to chart saying DM and HTN. He would like a letter stating that both conditions are under control and he isn't taking any medications for them. Please assist.

## 2015-09-27 NOTE — Telephone Encounter (Signed)
Letter in box. 

## 2015-09-29 ENCOUNTER — Ambulatory Visit (INDEPENDENT_AMBULATORY_CARE_PROVIDER_SITE_OTHER): Payer: Medicare Other | Admitting: Sports Medicine

## 2015-09-29 DIAGNOSIS — J0100 Acute maxillary sinusitis, unspecified: Secondary | ICD-10-CM

## 2015-09-29 MED ORDER — AZITHROMYCIN 250 MG PO TABS: ORAL_TABLET | ORAL | 0 refills | Status: AC

## 2015-09-29 NOTE — Assessment & Plan Note (Addendum)
Likely very early and mild maxillary sinusitis over considering relative immunosuppression with Enbrel we will treat aggressively. Azithromycin, Flonase. Return as needed.

## 2015-09-29 NOTE — Progress Notes (Signed)
.   Subjective:    CC: Headache  HPI: For the past several days this pleasant 65 year old male on Enbrel has had a bilateral frontal headache worse with head position changes, no photophobia, phonophobia, no nausea, no aura. He does have some stuffy nose, mild fevers and chills. Symptoms are moderate, persistent, there is radiation to the ears and teeth. Cranial nerves II through XII are intact, motor, sensory, and coordinative functions are all intact.  Past medical history, Surgical history, Family history not pertinant except as noted below, Social history, Allergies, and medications have been entered into the medical record, reviewed, and no changes needed.   Review of Systems: No fevers, chills, night sweats, weight loss, chest pain, or shortness of breath.   Objective:    General: Well Developed, well nourished, and in no acute distress.  Neuro: Alert and oriented x3, extra-ocular muscles intact, sensation grossly intact.  HEENT: Normocephalic, atraumatic, pupils equal round reactive to light, neck supple, no masses, no lymphadenopathy, thyroid nonpalpable. Oropharynx and ear canals are unremarkable, there is a leftward deviated septum with minimal turbinate bogginess. No erythema. No tenderness over the frontal or maxillary sinuses to percussion or palpation. Skin: Warm and dry, no rashes. Cardiac: Regular rate and rhythm, no murmurs rubs or gallops, no lower extremity edema.  Respiratory: Clear to auscultation bilaterally. Not using accessory muscles, speaking in full sentences.  Impression and Recommendations:    Acute non-recurrent maxillary sinusitis Azithromycin, Flonase. Return as needed.  I spent 25 minutes with this patient, greater than 50% was face-to-face time counseling regarding the above diagnoses
